# Patient Record
Sex: Female | Born: 1975 | Race: Black or African American | Hispanic: No | Marital: Single | Smoking: Current every day smoker
Health system: Southern US, Community
[De-identification: ages and names within clinical notes are randomized; demographics above are authoritative.]

## PROBLEM LIST (undated history)

## (undated) DIAGNOSIS — J40 Bronchitis, not specified as acute or chronic: Secondary | ICD-10-CM

## (undated) DIAGNOSIS — L309 Dermatitis, unspecified: Secondary | ICD-10-CM

## (undated) HISTORY — PX: FINGER SURGERY: SHX640

---

## 1997-08-02 ENCOUNTER — Inpatient Hospital Stay (HOSPITAL_COMMUNITY): Admission: AD | Admit: 1997-08-02 | Discharge: 1997-08-03 | Payer: Self-pay | Admitting: *Deleted

## 1997-09-30 ENCOUNTER — Emergency Department (HOSPITAL_COMMUNITY): Admission: EM | Admit: 1997-09-30 | Discharge: 1997-09-30 | Payer: Self-pay | Admitting: Emergency Medicine

## 1999-11-22 ENCOUNTER — Emergency Department (HOSPITAL_COMMUNITY): Admission: EM | Admit: 1999-11-22 | Discharge: 1999-11-22 | Payer: Self-pay | Admitting: Emergency Medicine

## 2000-03-21 ENCOUNTER — Other Ambulatory Visit: Admission: RE | Admit: 2000-03-21 | Discharge: 2000-03-21 | Payer: Self-pay | Admitting: Obstetrics

## 2000-03-21 ENCOUNTER — Encounter (INDEPENDENT_AMBULATORY_CARE_PROVIDER_SITE_OTHER): Payer: Self-pay | Admitting: Specialist

## 2000-06-21 ENCOUNTER — Emergency Department (HOSPITAL_COMMUNITY): Admission: EM | Admit: 2000-06-21 | Discharge: 2000-06-22 | Payer: Self-pay | Admitting: Emergency Medicine

## 2001-03-08 ENCOUNTER — Emergency Department (HOSPITAL_COMMUNITY): Admission: EM | Admit: 2001-03-08 | Discharge: 2001-03-08 | Payer: Self-pay | Admitting: Emergency Medicine

## 2001-03-08 ENCOUNTER — Encounter: Payer: Self-pay | Admitting: Emergency Medicine

## 2001-07-12 ENCOUNTER — Emergency Department (HOSPITAL_COMMUNITY): Admission: EM | Admit: 2001-07-12 | Discharge: 2001-07-12 | Payer: Self-pay | Admitting: Emergency Medicine

## 2002-04-24 ENCOUNTER — Inpatient Hospital Stay (HOSPITAL_COMMUNITY): Admission: AD | Admit: 2002-04-24 | Discharge: 2002-04-24 | Payer: Self-pay | Admitting: *Deleted

## 2002-07-29 ENCOUNTER — Inpatient Hospital Stay (HOSPITAL_COMMUNITY): Admission: AD | Admit: 2002-07-29 | Discharge: 2002-07-29 | Payer: Self-pay | Admitting: Obstetrics and Gynecology

## 2002-08-01 ENCOUNTER — Encounter: Payer: Self-pay | Admitting: Obstetrics & Gynecology

## 2002-08-01 ENCOUNTER — Ambulatory Visit (HOSPITAL_COMMUNITY): Admission: RE | Admit: 2002-08-01 | Discharge: 2002-08-01 | Payer: Self-pay | Admitting: Obstetrics & Gynecology

## 2002-10-18 ENCOUNTER — Emergency Department (HOSPITAL_COMMUNITY): Admission: EM | Admit: 2002-10-18 | Discharge: 2002-10-18 | Payer: Self-pay | Admitting: Emergency Medicine

## 2002-10-18 ENCOUNTER — Encounter: Payer: Self-pay | Admitting: Emergency Medicine

## 2003-07-03 ENCOUNTER — Emergency Department (HOSPITAL_COMMUNITY): Admission: EM | Admit: 2003-07-03 | Discharge: 2003-07-03 | Payer: Self-pay | Admitting: Emergency Medicine

## 2004-11-10 ENCOUNTER — Inpatient Hospital Stay (HOSPITAL_COMMUNITY): Admission: EM | Admit: 2004-11-10 | Discharge: 2004-11-12 | Payer: Self-pay | Admitting: Emergency Medicine

## 2004-11-13 ENCOUNTER — Encounter (HOSPITAL_COMMUNITY): Admission: RE | Admit: 2004-11-13 | Discharge: 2005-02-11 | Payer: Self-pay | Admitting: Orthopedic Surgery

## 2004-12-08 ENCOUNTER — Encounter: Admission: RE | Admit: 2004-12-08 | Discharge: 2005-03-08 | Payer: Self-pay | Admitting: Orthopedic Surgery

## 2005-03-02 ENCOUNTER — Emergency Department (HOSPITAL_COMMUNITY): Admission: EM | Admit: 2005-03-02 | Discharge: 2005-03-02 | Payer: Self-pay | Admitting: Emergency Medicine

## 2006-02-09 ENCOUNTER — Emergency Department (HOSPITAL_COMMUNITY): Admission: EM | Admit: 2006-02-09 | Discharge: 2006-02-09 | Payer: Self-pay | Admitting: Emergency Medicine

## 2006-02-28 ENCOUNTER — Emergency Department (HOSPITAL_COMMUNITY): Admission: EM | Admit: 2006-02-28 | Discharge: 2006-02-28 | Payer: Self-pay | Admitting: Family Medicine

## 2006-06-24 ENCOUNTER — Inpatient Hospital Stay (HOSPITAL_COMMUNITY): Admission: AD | Admit: 2006-06-24 | Discharge: 2006-06-24 | Payer: Self-pay | Admitting: Obstetrics & Gynecology

## 2006-07-18 ENCOUNTER — Inpatient Hospital Stay (HOSPITAL_COMMUNITY): Admission: AD | Admit: 2006-07-18 | Discharge: 2006-07-18 | Payer: Self-pay | Admitting: Family Medicine

## 2006-09-26 ENCOUNTER — Ambulatory Visit (HOSPITAL_COMMUNITY): Admission: RE | Admit: 2006-09-26 | Discharge: 2006-09-26 | Payer: Self-pay | Admitting: Obstetrics

## 2006-10-27 ENCOUNTER — Ambulatory Visit (HOSPITAL_COMMUNITY): Admission: RE | Admit: 2006-10-27 | Discharge: 2006-10-27 | Payer: Self-pay | Admitting: Obstetrics

## 2006-11-03 ENCOUNTER — Ambulatory Visit (HOSPITAL_COMMUNITY): Admission: RE | Admit: 2006-11-03 | Discharge: 2006-11-03 | Payer: Self-pay | Admitting: Obstetrics

## 2006-11-17 ENCOUNTER — Inpatient Hospital Stay (HOSPITAL_COMMUNITY): Admission: AD | Admit: 2006-11-17 | Discharge: 2007-01-14 | Payer: Self-pay | Admitting: Obstetrics & Gynecology

## 2006-11-17 ENCOUNTER — Encounter: Payer: Self-pay | Admitting: Obstetrics

## 2006-11-28 ENCOUNTER — Encounter: Payer: Self-pay | Admitting: Obstetrics & Gynecology

## 2006-12-05 ENCOUNTER — Encounter: Payer: Self-pay | Admitting: Obstetrics & Gynecology

## 2006-12-12 ENCOUNTER — Encounter: Payer: Self-pay | Admitting: Obstetrics

## 2006-12-19 ENCOUNTER — Encounter: Payer: Self-pay | Admitting: Obstetrics

## 2006-12-26 ENCOUNTER — Encounter: Payer: Self-pay | Admitting: Obstetrics

## 2007-01-03 ENCOUNTER — Encounter: Payer: Self-pay | Admitting: Obstetrics & Gynecology

## 2007-01-09 ENCOUNTER — Encounter: Payer: Self-pay | Admitting: Obstetrics

## 2007-01-23 ENCOUNTER — Ambulatory Visit (HOSPITAL_COMMUNITY): Admission: RE | Admit: 2007-01-23 | Discharge: 2007-01-23 | Payer: Self-pay | Admitting: Obstetrics

## 2007-01-26 ENCOUNTER — Inpatient Hospital Stay (HOSPITAL_COMMUNITY): Admission: AD | Admit: 2007-01-26 | Discharge: 2007-01-26 | Payer: Self-pay | Admitting: Obstetrics

## 2007-01-26 ENCOUNTER — Ambulatory Visit (HOSPITAL_COMMUNITY): Admission: RE | Admit: 2007-01-26 | Discharge: 2007-01-26 | Payer: Self-pay | Admitting: Obstetrics

## 2007-01-30 ENCOUNTER — Inpatient Hospital Stay (HOSPITAL_COMMUNITY): Admission: AD | Admit: 2007-01-30 | Discharge: 2007-02-02 | Payer: Self-pay | Admitting: Obstetrics & Gynecology

## 2007-01-30 ENCOUNTER — Encounter: Payer: Self-pay | Admitting: Obstetrics

## 2007-01-31 ENCOUNTER — Encounter: Payer: Self-pay | Admitting: Obstetrics & Gynecology

## 2007-03-01 ENCOUNTER — Emergency Department (HOSPITAL_COMMUNITY): Admission: EM | Admit: 2007-03-01 | Discharge: 2007-03-01 | Payer: Self-pay | Admitting: Emergency Medicine

## 2007-11-15 ENCOUNTER — Emergency Department (HOSPITAL_COMMUNITY): Admission: EM | Admit: 2007-11-15 | Discharge: 2007-11-15 | Payer: Self-pay | Admitting: Emergency Medicine

## 2008-03-13 ENCOUNTER — Emergency Department (HOSPITAL_COMMUNITY): Admission: EM | Admit: 2008-03-13 | Discharge: 2008-03-13 | Payer: Self-pay | Admitting: Family Medicine

## 2008-04-28 IMAGING — US US OB TRANSVAGINAL
1 series · 14 of 28 positions shown · non-contrast
Comparison: none

OBSTETRICAL ULTRASOUND:
 This ultrasound was performed in The [HOSPITAL], and the [facility] report will be stored to [REDACTED] PACS.

[Series 1: us ob transvaginal · 14 of 60 slices shown]
[im 3/60]
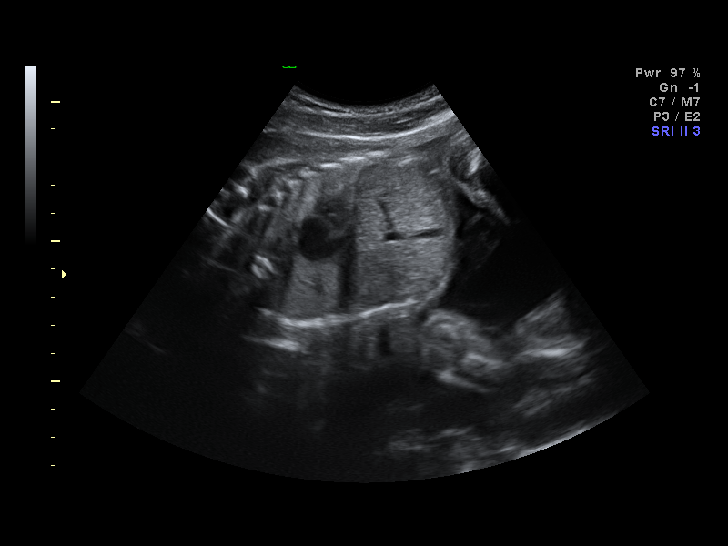
[im 7/60]
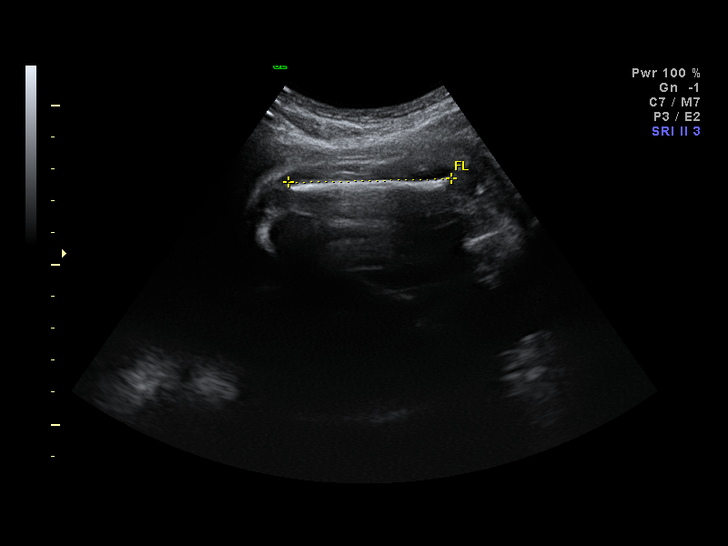
[im 11/60]
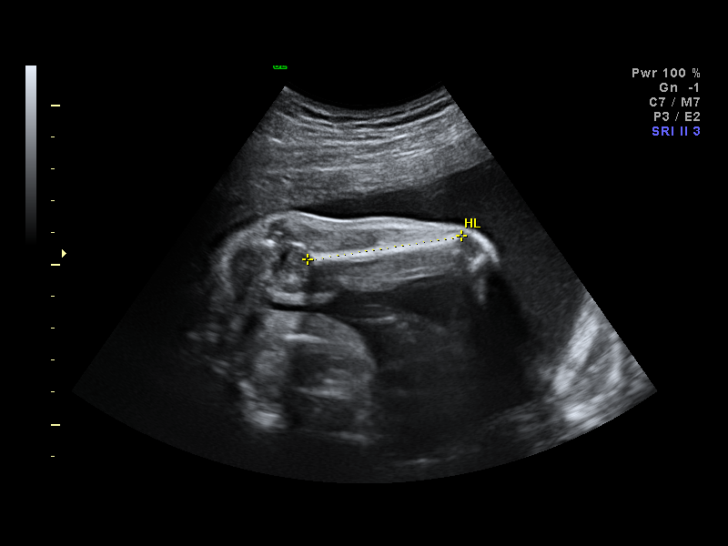
[im 16/60]
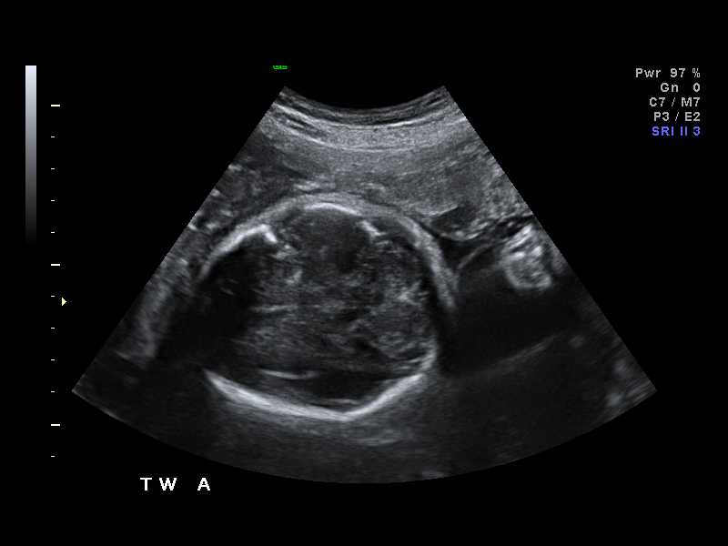
[im 20/60]
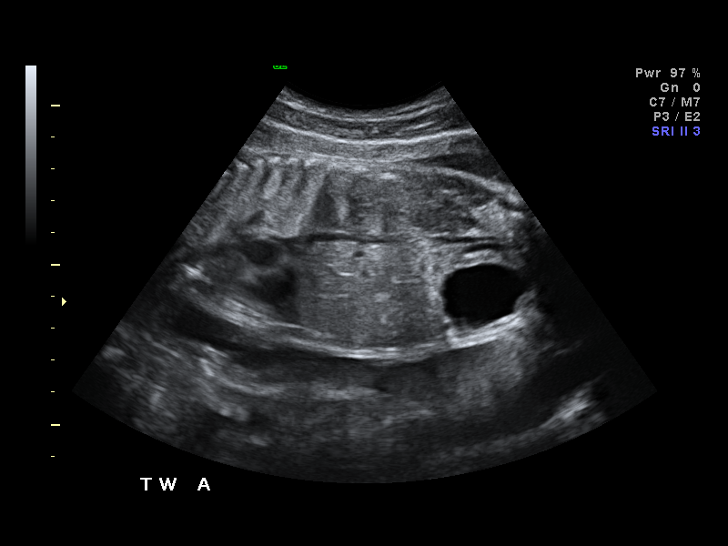
[im 25/60]
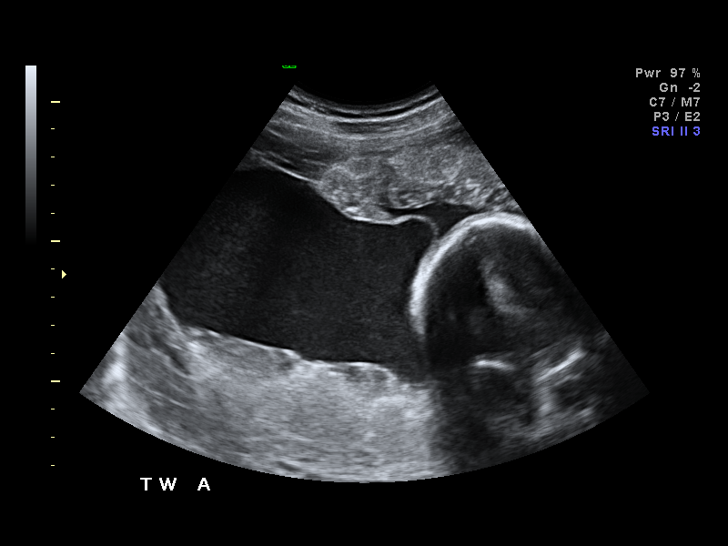
[im 29/60]
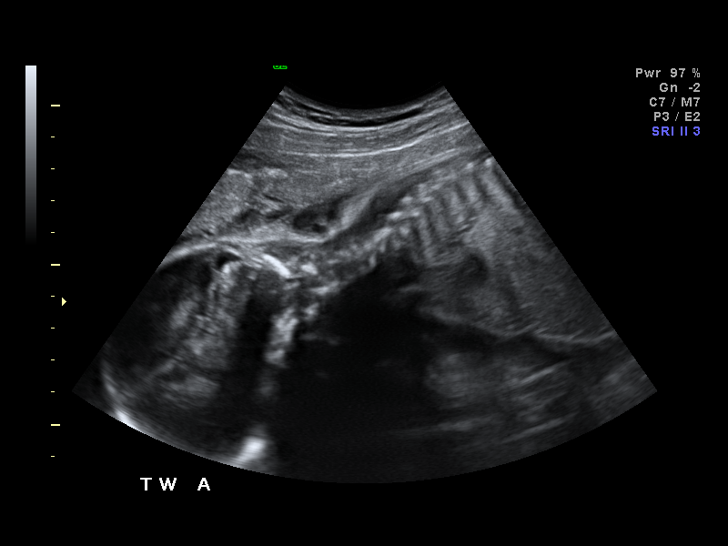
[im 33/60]
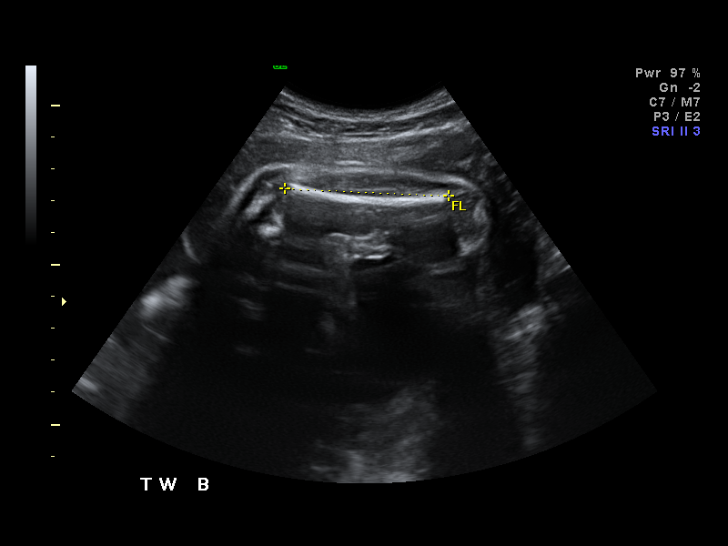
[im 38/60]
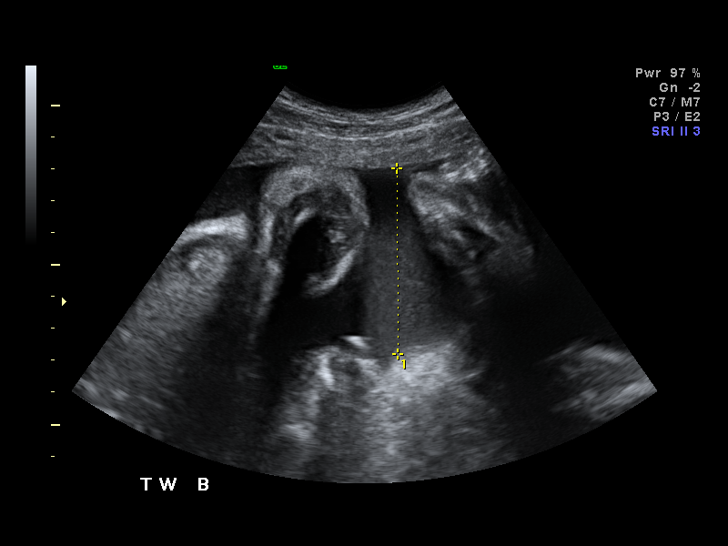
[im 42/60]
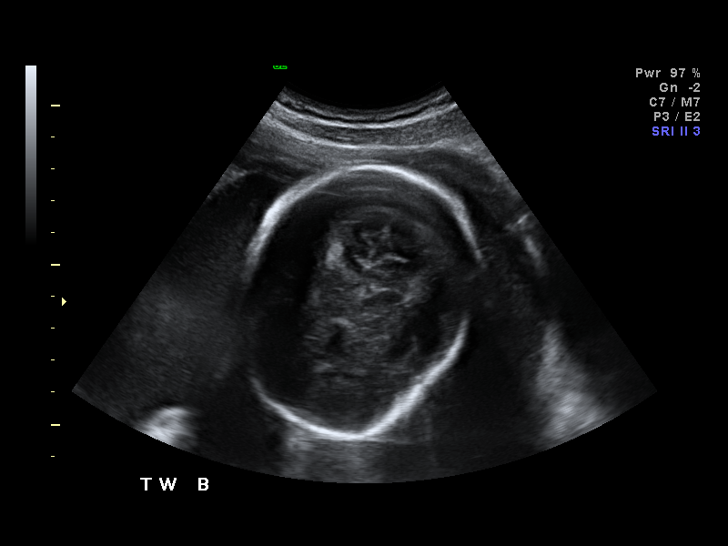
[im 46/60]
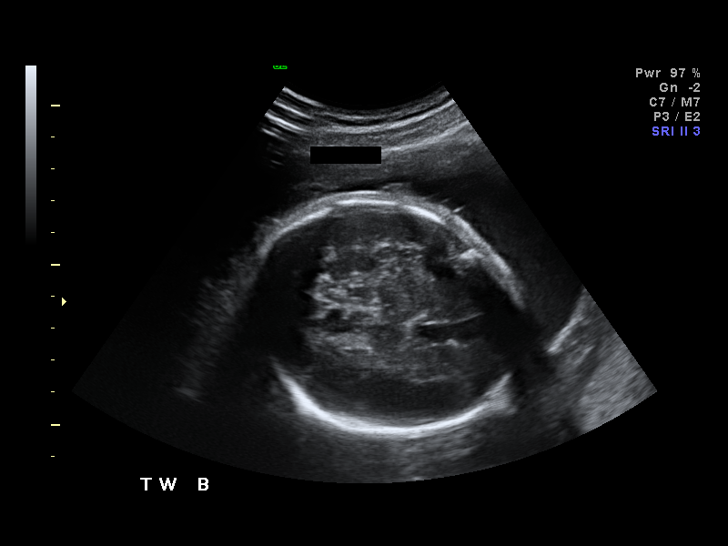
[im 51/60]
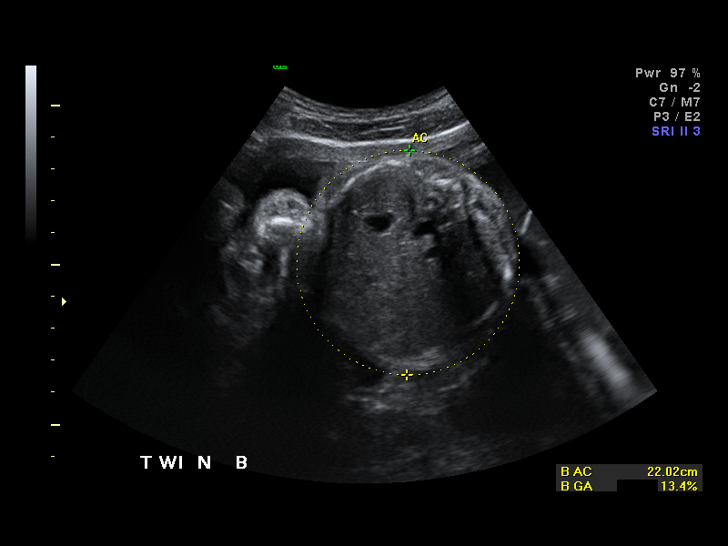
[im 55/60]
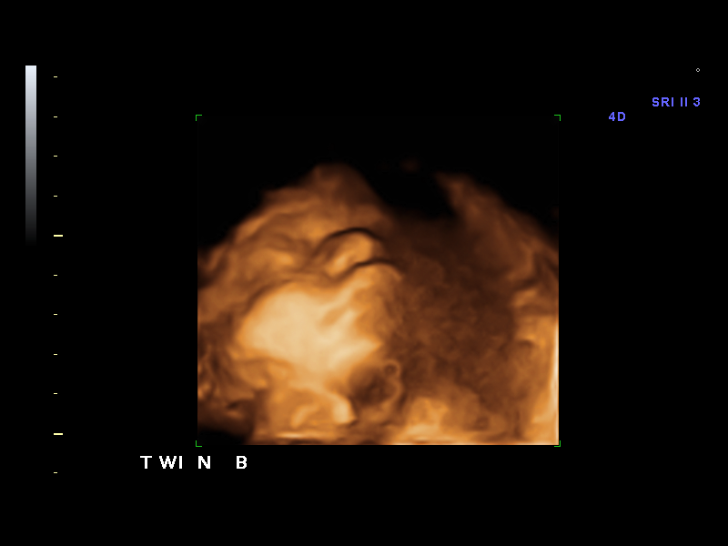
[im 60/60]
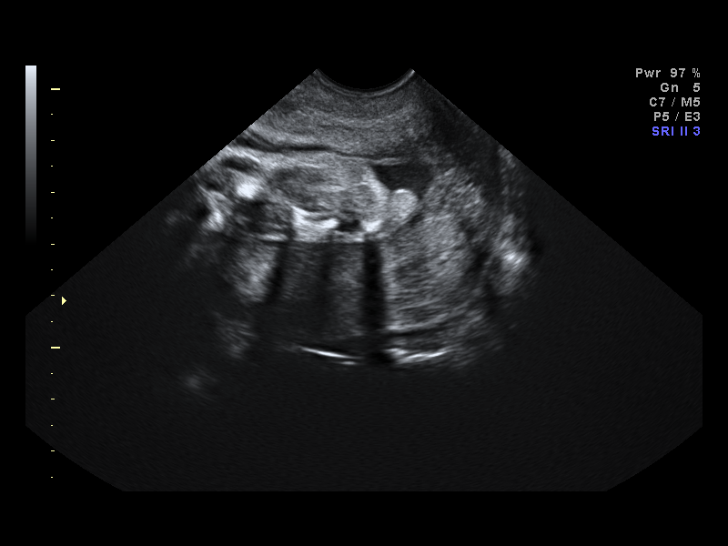

[14 of 28 positions shown; findings below may reference images not displayed]

IMPRESSION: The [facility] report has also been faxed to the ordering physician.

## 2008-05-26 IMAGING — US US OB TRANSVAGINAL MODIFY
1 series · 14 of 28 positions shown · non-contrast
Comparison: none

OBSTETRICAL ULTRASOUND:
 This ultrasound was performed in The [facility]], and the [facility] report will be stored to [REDACTED] PACS.

[Series 1: us ob transvaginal modify · 14 of 65 slices shown]
[im 3/65]
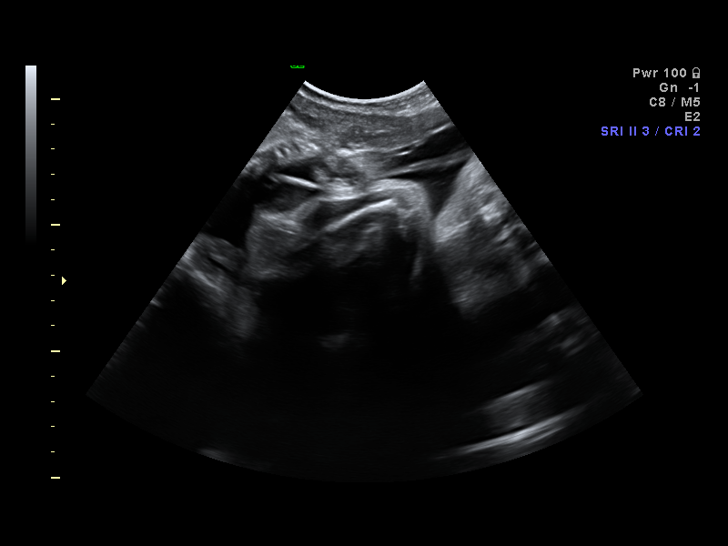
[im 8/65]
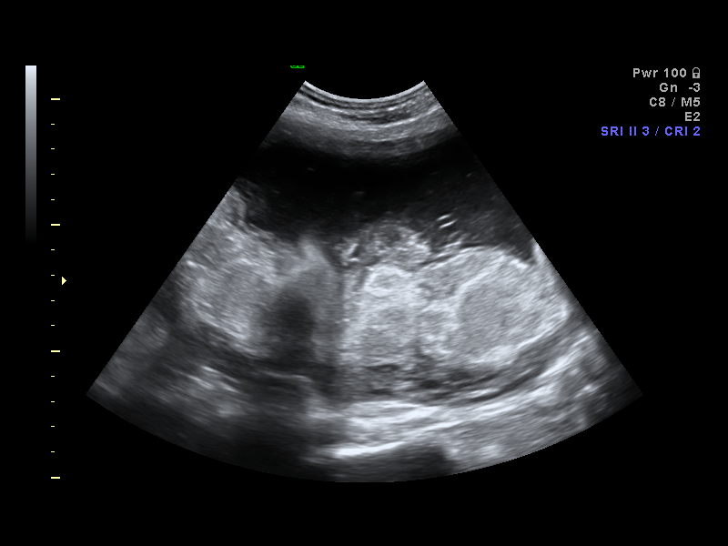
[im 12/65]
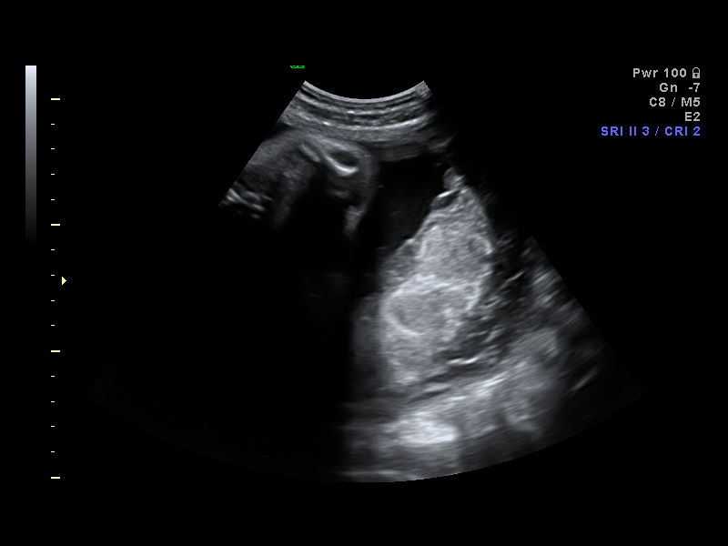
[im 17/65]
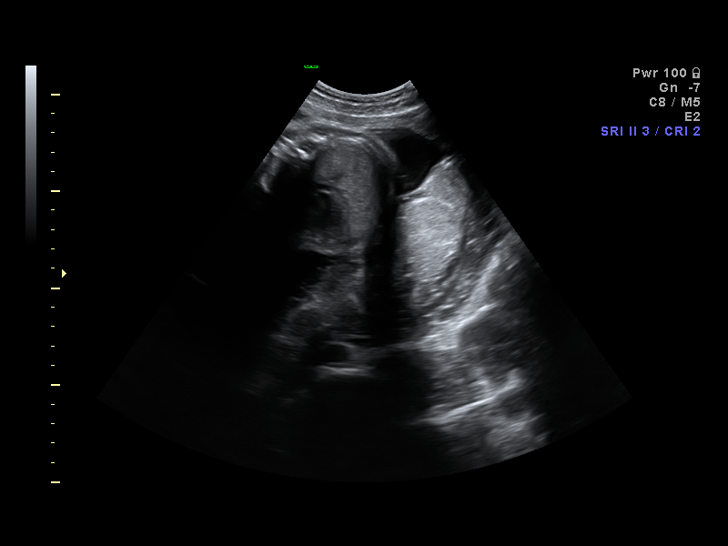
[im 22/65]
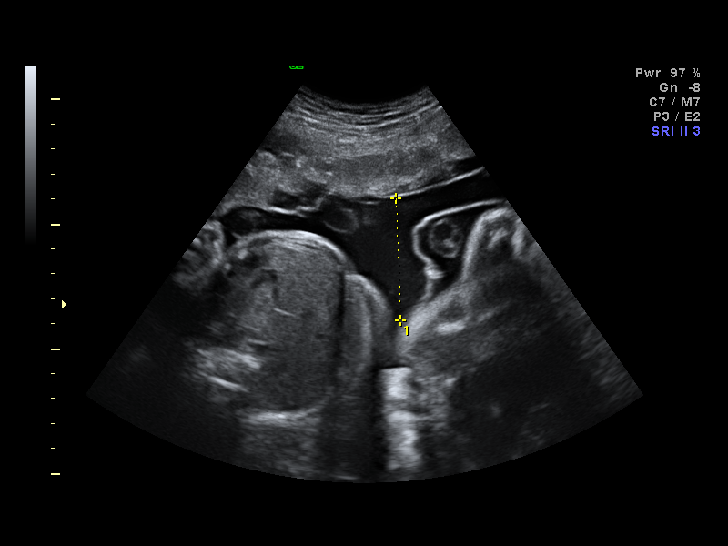
[im 27/65]
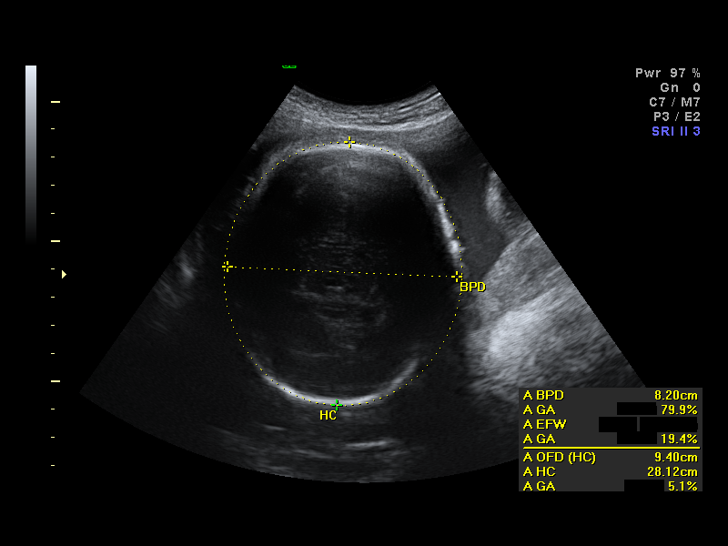
[im 31/65]
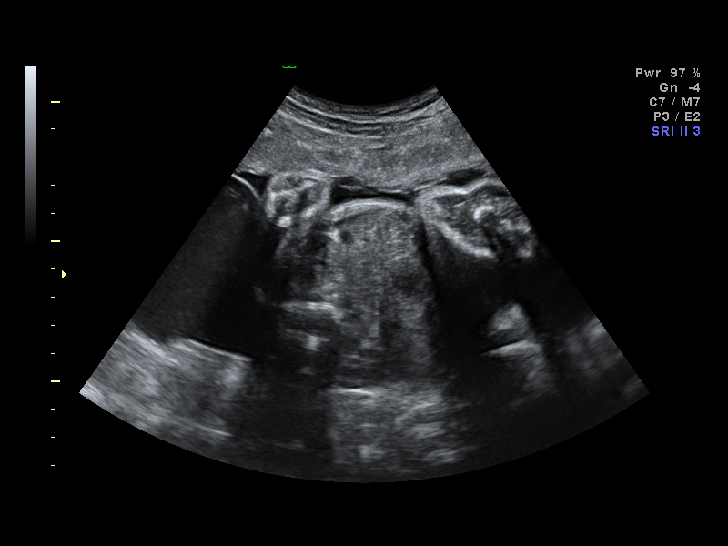
[im 36/65]
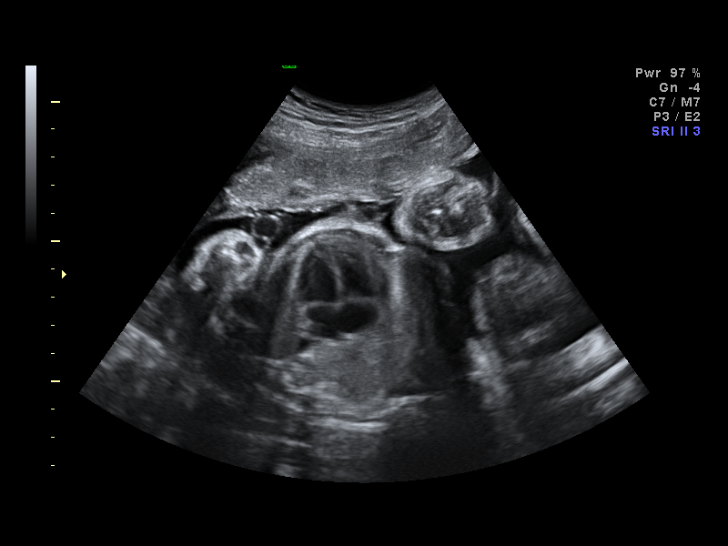
[im 41/65]
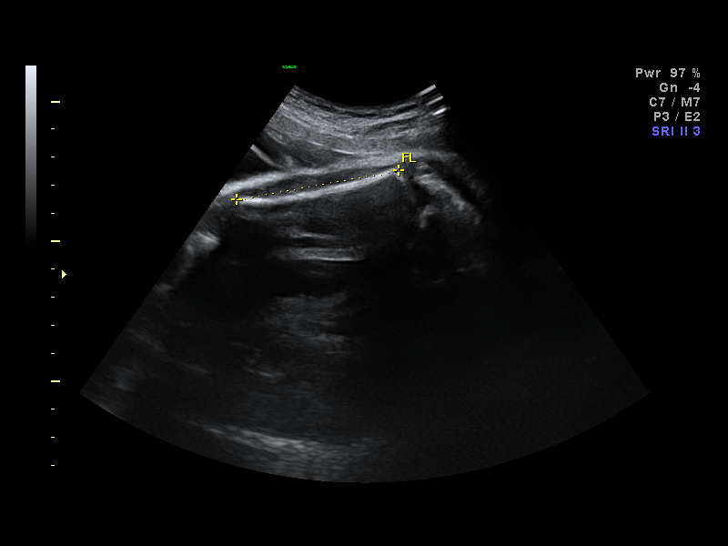
[im 46/65]
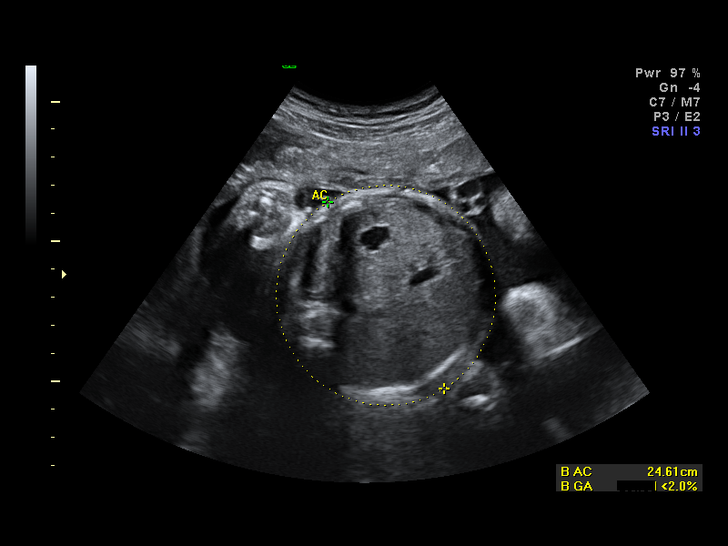
[im 50/65]
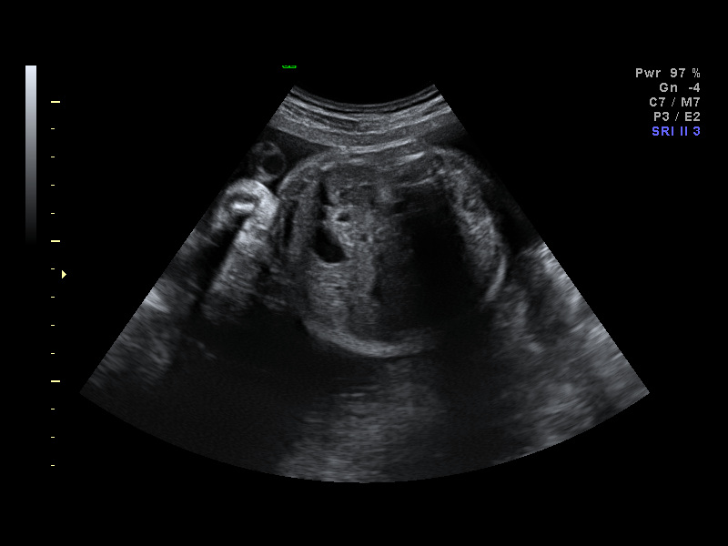
[im 55/65]
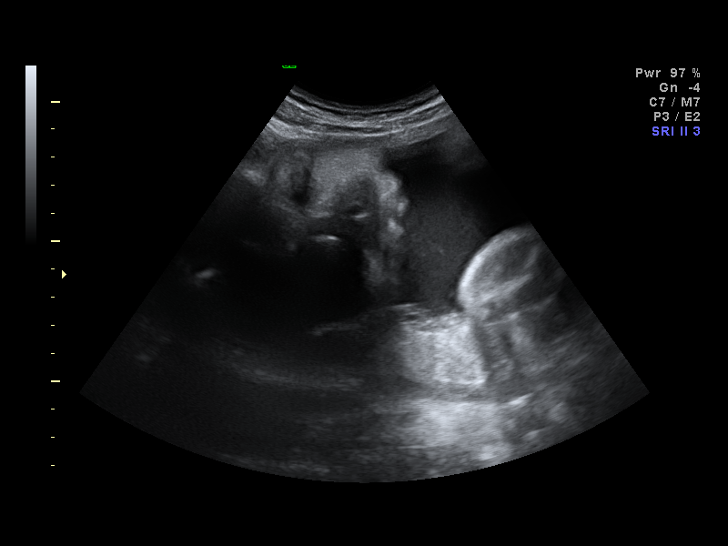
[im 60/65]
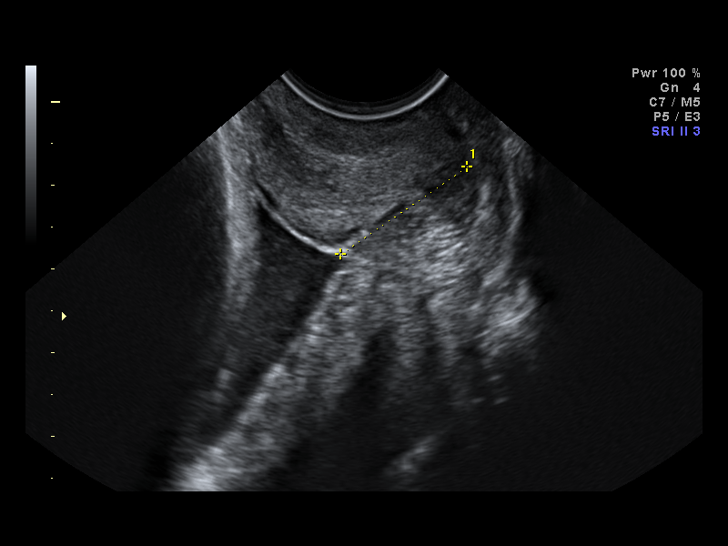
[im 65/65]
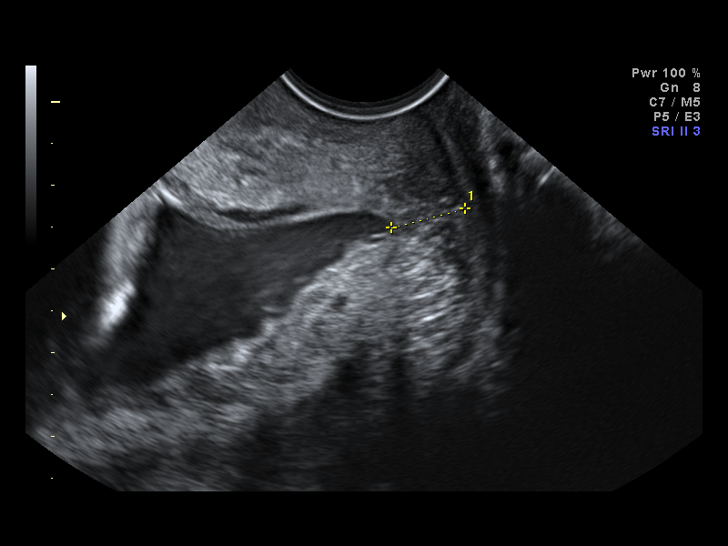

[14 of 28 positions shown; findings below may reference images not displayed]

IMPRESSION: The [facility] report has also been faxed to the ordering physician.

## 2010-06-07 ENCOUNTER — Encounter: Payer: Self-pay | Admitting: Obstetrics

## 2010-06-08 ENCOUNTER — Encounter: Payer: Self-pay | Admitting: Obstetrics

## 2010-09-29 NOTE — H&P (Signed)
NAMESEARRA, [name]          ACCOUNT NO.:  [id number]   MEDICAL RECORD NO.:  [id number]          PATIENT TYPE:  INP   LOCATION:  9157                          FACILITY:  [facility]   PHYSICIAN:  Roseanna Rainbow, M.D.DATE OF BIRTH:  10/10/1975   DATE OF ADMISSION:  11/17/2006  DATE OF DISCHARGE:                              HISTORY & PHYSICAL   CHIEF COMPLAINT:  The patient is a 35 year old, gravida 4, para 1 with  an estimated date of confinement of October 9 with a twin gestation at  26+ weeks with threatened preterm labor.   HISTORY OF PRESENT ILLNESS:  The patient has a history of a preterm  delivery at 27 weeks and a possible second trimester spontaneous  abortion.  She has [facility].  Heightened surveillance  has included serial cervical length ultrasound studies as well as  ultrasounds for growth.  She has also been receiving weekly 17  hydroxyprogesterone injections.  On ultrasound today, the cervical  length was 1.1 cm with funneling noted.  The patient reports  intermittent contractions.  She denies any leakage of fluid or abnormal  discharge.   ALLERGIES:  No known drug allergies.   MEDICATIONS:  Prenatal vitamins, please see the above.   OB RISK FACTORS:  Please see the above.  The patient is Rh negative.   PRENATAL SCREENS:  Hematocrit 28.4, hemoglobin 9.5, platelets 246,000,  Chlamydia probe negative.  GC probe negative, 1 hour GCT 72, urine  culture and sensitivity no growth.  HIV nonreactive.  Blood type is O  negative, antibody screen negative, RPR nonreactive, rubella equivocal,  sickle cell negative.   SOCIAL HISTORY:  She denies any alcohol use.  She reports cigars  occasionally.   PAST GYN HISTORY:  1. History of Trichomonas.  2. D&C for a voluntary termination of pregnancy.   PAST MEDICAL HISTORY:  Eczema.   PAST SURGICAL HISTORY:  Hand surgery.   PAST  OBSTETRICAL HISTORY:  1. In '94 she was delivered of a female at 14 weeks.  That pregnancy      was complicated by preterm labor, preterm delivery.  2. In '99 she had a second trimester spontaneous abortion.  3. In 2004 there was a voluntary termination of pregnancy.   PHYSICAL EXAMINATION:  VITAL SIGNS:  Temperature 98.2, blood pressure  93/57.  Fetal heart tracings consistent with the estimated gestational  age x2.  Tocodynamometer rare low amplitude, less than 30 second  duration contractions.  PELVIC EXAM:  Deferred.   LABORATORY DATA:  Wet prep negative.  Hemoglobin 9.4, urinalysis  negative.   ASSESSMENT:  1. Twin gestation at 26+ weeks.  2. Threatened preterm labor.  3. Rh negative.   PLAN:  1. Bed rest.  2. Magnesium sulfate tocolysis.  3. Betamethasone.  4. Continue 17P injection.  5. Clindamycin parenterally.      Roseanna Rainbow, M.D.  Electronically Signed     LAJ/MEDQ  D:  11/17/2006  T:  11/17/2006  Job:  [id number]

## 2010-09-29 NOTE — Discharge Summary (Signed)
[name], [name]          ACCOUNT NO.:  [id number]   MEDICAL RECORD NO.:  [id number]          PATIENT TYPE:  INP   LOCATION:  9152                          FACILITY:  [facility]   PHYSICIAN:  Roseanna Rainbow, M.D.DATE OF BIRTH:  03-15-1976   DATE OF ADMISSION:  11/17/2006  DATE OF DISCHARGE:  01/14/2007                               DISCHARGE SUMMARY   CHIEF COMPLAINT:  The patient is a 35 year old gravida 4, para 1 with an  estimated date of confinement of October 9 with a twin gestation at 26+  weeks with complaints of contractions.  Please see the dictated History  and Physical for further details.   HOSPITAL COURSE:  The patient was admitted.  She was started on  magnesium sulfate for tocolysis.  On ultrasound the cervical length was  1.1 cm in length with funneling.  The patient was adequately tocalized,  and her contractions became infrequent.  On repeat cervical length  measurement on July 7 the cervix was 0.8 to 1 cm in length.  There was  no change essentially.  She was given a course of parenteral clindamycin  on ultrasound on September 15.  Again the cervical length was stable at  0.821 cm with funneling.  Stable growth of the twins.  The patient plans  a sterilization.  The 30-day Medicaid papers were signed on August 16.  An ultrasound on August 19 the BPP's were 8/8 for both fetuses. AFI was  normal for both fetuses.  The cervical length was 1.2 cm with funneling.  Magnesium sulfate was discontinued at [redacted] weeks gestation on August 28.  She remained stable without the magnesium sulfate.  On August 30 the  cervix was examined and was a fingertip with lower uterine segment  development appreciated.  At this point the decision was made to  discharge the patient to home.   DISCHARGE DIAGNOSES:  Twin gestation, threatened preterm labor.   CONDITION:  Stable.   DIET:  Regular.   ACTIVITY:  Modified bed rest.   DISPOSITION:  The patient was to follow up in the  office in one week.   MEDICATIONS:  Continue prenatal vitamins.      Roseanna Rainbow, M.D.  Electronically Signed     LAJ/MEDQ  D:  02/24/2007  T:  02/25/2007  Job:  [id number]

## 2010-09-29 NOTE — H&P (Signed)
[name], [name]          ACCOUNT NO.:  [id number]   MEDICAL RECORD NO.:  [id number]          PATIENT TYPE:  INP   LOCATION:  9168                          [facility]ACILITY:  [facility]   PHYSICIAN:  Roseanna Rainbow, M.D.DATE OF BIRTH:  12-May-1976   DATE OF ADMISSION:  01/30/2007  DATE OF DISCHARGE:                              HISTORY & PHYSICAL   CHIEF COMPLAINT:  The patient is a 35 year old gravida 4, para 1 with an  estimated date of confinement of February 23, 2007, with a twin gestation  at 36+ weeks, for induction of labor secondary to intrauterine growth  restriction, discordant growth and elevated umbilical artery Doppler  flow indices.   HISTORY OF PRESENT ILLNESS:  Please see the above. The patient had  presented to the maternal fetal center earlier today for routine  antenatal testing. A BPP for twin A was 6 out of 10, a BPP for twin B  was 8 out of 10.  The umbilical artery Doppler S/D ratios were elevated  for both fetuses.  Biometry for twin A was consistent with fetal growth  restriction as well as discordant growth noted for both twins.   ALLERGIES:  No known drug allergies.   MEDICATIONS:  None.   SOCIAL HISTORY:  She denies any tobacco, ethanol or drug use.   OB RISK FACTORS:  Twin gestation, threatened preterm labor, please see  the above prenatal labs. Rh negative, non sensitized.   PRENATAL LABS:  Blood type is O negative, antibody screen negative. RPR  is nonreactive, rubella titer is equivocal, hepatitis B surface antigen  negative.  GBS unknown.  HIV nonreactive.  Hemoglobin 9.5, hematocrit  28.4, platelets 246,000.  Chlamydia probe negative. GC probe negative.  One hour GCT 72. Sickle cell negative.   PAST OBSTETRICAL HISTORY:  There is a history of a preterm vaginal  delivery at 27 weeks, and a spontaneous abortion.   PAST SURGICAL HISTORY:  Torn ligament repair.   PAST MEDICAL HISTORY:  She denies.   PHYSICAL EXAMINATION:  VITAL SIGNS:  Stable,  afebrile. Fetal heart  tracings minimally reactive x2.  Tocodynamometry - irregular uterine  contractions.  STERILE VAGINAL EXAM:  Per the RN, cervix is 5 cm dilated, 60-80%  effaced. Twin A's head is ballotable.   ASSESSMENT:  Twin gestation at 36+ weeks with intrauterine growth  restriction, discordant growth. Suspicious antenatal testing with  elevated umbilical artery S/D ratios.  Favorable Bishop score.   PLAN:  Admission, induction of labor. The planned mode of induction is  pitocin artificial rupture of membranes. Will treat empirically for GBS  prophylaxis.      Roseanna Rainbow, M.D.  Electronically Signed     LAJ/MEDQ  D:  01/30/2007  T:  01/30/2007  Job:  [id number]

## 2010-09-29 NOTE — Discharge Summary (Signed)
NAMEBRETTA, FEES          ACCOUNT NO.:  [id number]   MEDICAL RECORD NO.:  [id number]          PATIENT TYPE:  INP   LOCATION:  9104                          [facility]ACILITY:  [facility]   PHYSICIAN:  Roseanna Rainbow, M.D.DATE OF BIRTH:  [birth year]   DATE OF ADMISSION:  01/30/2007  DATE OF DISCHARGE:  02/02/2007                               DISCHARGE SUMMARY   CHIEF COMPLAINT:  The patient is a 35 year old gravida 4, para 1 with an  estimated date of confinement of October 9th, with a twin gestation at  36+ weeks for induction of labor, secondary to intrauterine growth  restriction, discordant growth an elevated umbilical artery Doppler flow  indices.  Please see the dictated history and physical for further  details.   HOSPITAL COURSE:  The patient was admitted.  Low-dose Pitocin was  started per protocol.  The membranes were artificially ruptured in early  active labor.  She progressed to fully dilated.  Twin A was delivered  vertex.  Twin B remained transverse back up.  There was an attempt to  perform an external cephalic version that failed, and the decision was  made to proceed with a cesarean delivery for the second twin. Please see  the dictated operative summary for further details.  On postoperative  day #1, her hemoglobin was 8.7.  She was hemodynamically stable.  The  remainder of her hospital course was uneventful.  She was discharged to  home on postoperative day #2.   DISCHARGE DIAGNOSIS:  Twin gestation at 21 weeks, discordant growth,  intrauterine growth restriction, elevated umbilical artery Doppler  indices.   PROCEDURE:  Vaginal delivery and cesarean delivery, condition stable.   DIET:  Regular.   ACTIVITY:  Progressive activity, pelvic rest.   MEDICATIONS:  Included Percocet, ibuprofen, prenatal vitamins, ferrous  sulfate and Colace.   DISPOSITION:  The patient was to follow up on September 19 in the office  for staple removal.      Roseanna Rainbow, M.D.  Electronically Signed     LAJ/MEDQ  D:  02/24/2007  T:  02/25/2007  Job:  [id number]

## 2010-09-29 NOTE — Op Note (Signed)
[name], [name]          ACCOUNT NO.:  [id number]   MEDICAL RECORD NO.:  [id number]          PATIENT TYPE:  INP   LOCATION:  9104                          [facility]   PHYSICIAN:  Roseanna Rainbow, M.D.DATE OF BIRTH:  Jun 03, 1975   DATE OF PROCEDURE:  01/31/2007  DATE OF DISCHARGE:                               OPERATIVE REPORT   PREOPERATIVE DIAGNOSIS:  Twin B, transverse lie, post delivery of twin  A,  failed external cephalic version attempt, discordant growth with  twin B being the larger twin, desires sterilization procedure.   POSTOPERATIVE DIAGNOSIS:  Twin B, transverse lie, post delivery of twin  A,  failed external cephalic version attempt, discordant growth with  twin B being the larger twin, desires sterilization procedure.   PROCEDURE:  Primary low uterine flap elliptical cesarean delivery and  bilateral tubal ligation.   SURGEON:  Roseanna Rainbow, M.D.   PATHOLOGY:  Placenta.   ANESTHESIA:  Epidural.   ESTIMATED BLOOD LOSS:  800 mL.   COMPLICATIONS:  None.   DESCRIPTION OF PROCEDURE:  The patient was placed in the dorsal supine  position with a leftward tilt and prepped and draped in the usual  sterile fashion.   After a timeout had been completed, a Pfannenstiel skin incision was  then made with the scalpel and carried down to the underlying fascia.  The fascia was incised along the length of the incision with the  scalpel.  The rectus muscles were separated bluntly.  The parietal  peritoneum was entered bluntly, and the incision was extended bluntly.  The vesicouterine peritoneum was tented up and entered sharply.  This  incision was then extended bilaterally and a bladder flap created  bluntly.  The bladder blade was placed.  The lower uterine segment was  incised in a transverse fashion with the scalpel.  The uterine incision  was then extended bluntly.  A complete breech extraction was then  performed.  Mauriceau-Smellie-Veit  maneuver was used to facilitate  delivery of the infant's head which was delivered atraumatically.  The  oropharynx was suctioned with a bulb suction.  The cord was clamped and  cut.  The infant was handed off to the awaiting neonatologist.  The  placenta was then removed.  The intrauterine cavity was evacuated of any  remaining amniotic fluid, clots, and debris with a moistened laparotomy  sponge.  The uterine incision was then reapproximated in a running  interlocking fashion using 0 Monocryl.   The left fallopian tube was then identified.  The fallopian tube was  grasped with a Babcock clamp at the mid-isthmic portion.  A window was  made in the mesosalpinx, and a 2-cm segment of tube was then tied off  with 0 plain ties and then excised.  Adequate hemostasis was noted.  The  right fallopian tube was then identified.  The mid-isthmic portion of  the tube was grasped with a Babcock clamp.  A 2-cm segment of tube was  then doubly ligated with 0 plain and excised.  Adequate hemostasis was  noted.  The paracolic gutters were then irrigated.  The parietal  peritoneum was  reapproximated in a running fashion using 2-0 Vicryl.  The fascia was closed in a running fashion  using 0 PDS.  The skin was closed with staples.  At the close of the  procedure, the instrument and pack counts were said to be correct x2.   The patient was taken to the PACU awake and in stable condition.      Roseanna Rainbow, M.D.  Electronically Signed     LAJ/MEDQ  D:  01/31/2007  T:  01/31/2007  Job:  [id number]
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
## 2010-11-23 ENCOUNTER — Inpatient Hospital Stay (INDEPENDENT_AMBULATORY_CARE_PROVIDER_SITE_OTHER)
Admission: RE | Admit: 2010-11-23 | Discharge: 2010-11-23 | Disposition: A | Discharge status: Home / Self Care | Payer: Self-pay | Source: Ambulatory Visit | Attending: Family Medicine | Admitting: Family Medicine

## 2010-11-23 DIAGNOSIS — M25519 Pain in unspecified shoulder: Secondary | ICD-10-CM

## 2011-02-25 LAB — RH IMMUNE GLOB WKUP(>/=20WKS)(NOT WOMEN'S HOSP)

## 2011-02-25 LAB — CBC
HCT: 30.8 — ABNORMAL LOW
Hemoglobin: 10.7 — ABNORMAL LOW
MCHC: 34.7
MCHC: 34.7
MCV: 91.2
Platelets: 199
RDW: 12.6
RDW: 12.6

## 2011-02-26 LAB — RH IMMUNE GLOBULIN WORKUP (NOT WOMEN'S HOSP): Antibody Screen: NEGATIVE

## 2011-03-02 LAB — CBC
HCT: 27.6 — ABNORMAL LOW
Hemoglobin: 9.4 — ABNORMAL LOW
Platelets: 255
RBC: 2.96 — ABNORMAL LOW
RDW: 13.4

## 2011-03-02 LAB — URINALYSIS, ROUTINE W REFLEX MICROSCOPIC
Bilirubin Urine: NEGATIVE
Hgb urine dipstick: NEGATIVE
Specific Gravity, Urine: 1.01
Urobilinogen, UA: 0.2

## 2011-03-02 LAB — WET PREP, GENITAL
Clue Cells Wet Prep HPF POC: NONE SEEN
Trich, Wet Prep: NONE SEEN
Yeast Wet Prep HPF POC: NONE SEEN

## 2011-07-23 ENCOUNTER — Emergency Department (HOSPITAL_COMMUNITY): Payer: Self-pay

## 2011-07-23 ENCOUNTER — Emergency Department (HOSPITAL_COMMUNITY)
Admission: EM | Admit: 2011-07-23 | Discharge: 2011-07-23 | Disposition: A | Discharge status: Home / Self Care | Payer: Self-pay | Attending: Emergency Medicine | Admitting: Emergency Medicine

## 2011-07-23 ENCOUNTER — Encounter (HOSPITAL_COMMUNITY): Payer: Self-pay | Admitting: Emergency Medicine

## 2011-07-23 DIAGNOSIS — R059 Cough, unspecified: Secondary | ICD-10-CM | POA: Insufficient documentation

## 2011-07-23 DIAGNOSIS — J349 Unspecified disorder of nose and nasal sinuses: Secondary | ICD-10-CM

## 2011-07-23 DIAGNOSIS — J3489 Other specified disorders of nose and nasal sinuses: Secondary | ICD-10-CM | POA: Insufficient documentation

## 2011-07-23 DIAGNOSIS — R05 Cough: Secondary | ICD-10-CM | POA: Insufficient documentation

## 2011-07-23 DIAGNOSIS — J40 Bronchitis, not specified as acute or chronic: Secondary | ICD-10-CM | POA: Insufficient documentation

## 2011-07-23 DIAGNOSIS — R51 Headache: Secondary | ICD-10-CM | POA: Insufficient documentation

## 2011-07-23 HISTORY — DX: Dermatitis, unspecified: L30.9

## 2011-07-23 MED ORDER — ALBUTEROL SULFATE HFA 108 (90 BASE) MCG/ACT IN AERS
2.0000 | INHALATION_SPRAY | RESPIRATORY_TRACT | Status: DC | PRN
  Administered 2011-07-23: 2 via RESPIRATORY_TRACT
  Filled 2011-07-23: qty 6.7

## 2011-07-23 MED ORDER — AZITHROMYCIN 250 MG PO TABS: 250.0000 mg | ORAL_TABLET | Freq: Every day | ORAL | Status: AC

## 2011-07-23 NOTE — ED Notes (Signed)
RT made aware of the albuterol HFA

## 2011-07-23 NOTE — ED Provider Notes (Signed)
Medical screening examination/treatment/procedure(s) were performed by non-physician practitioner and as supervising physician I was immediately available for consultation/collaboration.  Jase Reep R. Keitra Carusone, MD 07/23/11 0743 
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
## 2011-07-23 NOTE — Discharge Instructions (Signed)
Bronchitis Bronchitis is a problem of the air tubes leading to your lungs. This problem makes it hard for air to get in and out of the lungs. You may cough a lot because your air tubes are narrow. Going without care can cause lasting (chronic) bronchitis. HOME CARE   Drink enough fluids to keep your pee (urine) clear or pale yellow.   Use a cool mist humidifier.   Quit smoking if you smoke. If you keep smoking, the bronchitis might not get better.   Only take medicine as told by your doctor.  GET HELP RIGHT AWAY IF:   Coughing keeps you awake.   You start to wheeze.   You become more sick or weak.   You have a hard time breathing or get short of breath.   You cough up blood.   Coughing lasts more than 2 weeks.   You have a fever.   Your baby is older than 3 months with a rectal temperature of 102 F (38.9 C) or higher.   Your baby is 40 months old or younger with a rectal temperature of 100.4 F (38 C) or higher.  MAKE SURE YOU:  Understand these instructions.   Will watch your condition.   Will get help right away if you are not doing well or get worse.  Document Released: 10/20/2007 Document Revised: 04/22/2011 Document Reviewed: 04/04/2009 [facility] Patient Information 2012 [place], [place]. Use the inhaler as follows 2 puffs every 4-6 hours while awake if you wait during the night for the next 2 nights.  Feel free to use it one time after the 2 days.  Her use as neededSinusitis Sinuses are air pockets within the bones of your face. The growth of bacteria within a sinus leads to infection. The infection prevents the sinuses from draining. This infection is called sinusitis. SYMPTOMS  There will be different areas of pain depending on which sinuses have become infected.  The maxillary sinuses often produce pain beneath the eyes.   Frontal sinusitis may cause pain in the middle of the forehead and above the eyes.  Other problems (symptoms) include:  Toothaches.    Colored, pus-like (purulent) drainage from the nose.   Swelling, warmth, and tenderness over the sinus areas may be signs of infection.  TREATMENT  Sinusitis is most often determined by an exam.X-rays may be taken. If x-rays have been taken, make sure you obtain your results or find out how you are to obtain them. Your caregiver may give you medications (antibiotics). These are medications that will help kill the bacteria causing the infection. You may also be given a medication (decongestant) that helps to reduce sinus swelling.  HOME CARE INSTRUCTIONS   Only take over-the-counter or prescription medicines for pain, discomfort, or fever as directed by your caregiver.   Drink extra fluids. Fluids help thin the mucus so your sinuses can drain more easily.   Applying either moist heat or ice packs to the sinus areas may help relieve discomfort.   Use saline nasal sprays to help moisten your sinuses. The sprays can be found at your local drugstore.  SEEK IMMEDIATE MEDICAL CARE IF:  You have a fever.   You have increasing pain, severe headaches, or toothache.   You have nausea, vomiting, or drowsiness.   You develop unusual swelling around the face or trouble seeing.  MAKE SURE YOU:   Understand these instructions.   Will watch your condition.   Will get help right away if you are not  doing well or get worse.  Document Released: 05/03/2005 Document Revised: 04/22/2011 Document Reviewed: 11/30/2006 [facility] Patient Information 2012 [place], [place].

## 2011-07-23 NOTE — ED Notes (Signed)
Pt presented to the ER with sx of flue like s/s, states it all started about 3 weeks ago with cough, however, over the cores of time s/s are not improving, develop chest discomfort, cough, nausea and vomiting, tonight prior to arrival, denies diarrhea, reports generalizes soreness and weakness.

## 2011-10-22 ENCOUNTER — Encounter (HOSPITAL_COMMUNITY): Payer: Self-pay

## 2011-10-22 ENCOUNTER — Emergency Department (HOSPITAL_COMMUNITY)
Admission: EM | Admit: 2011-10-22 | Discharge: 2011-10-22 | Disposition: A | Discharge status: Home / Self Care | Payer: No Typology Code available for payment source | Attending: Emergency Medicine | Admitting: Emergency Medicine

## 2011-10-22 DIAGNOSIS — M62838 Other muscle spasm: Secondary | ICD-10-CM | POA: Insufficient documentation

## 2011-10-22 DIAGNOSIS — S63509A Unspecified sprain of unspecified wrist, initial encounter: Secondary | ICD-10-CM

## 2011-10-22 MED ORDER — METHOCARBAMOL 500 MG PO TABS: 1000.0000 mg | ORAL_TABLET | Freq: Four times a day (QID) | ORAL | Status: AC

## 2011-10-22 MED ORDER — NAPROXEN 500 MG PO TABS: 500.0000 mg | ORAL_TABLET | Freq: Two times a day (BID) | ORAL | Status: AC

## 2011-10-22 NOTE — Progress Notes (Signed)
Orthopedic Tech Progress Note Patient Details:  [name] August 20, 1975 [id number]  Ortho Devices Type of Ortho Device: Velcro wrist splint Ortho Device/Splint Interventions: Application   Cammer, Mickie Bail 10/22/2011, 2:14 PM
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
## 2011-10-22 NOTE — ED Provider Notes (Signed)
Medical screening examination/treatment/procedure(s) were performed by non-physician practitioner and as supervising physician I was immediately available for consultation/collaboration.   Celene Kras, MD 10/22/11 1400
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
## 2013-02-16 ENCOUNTER — Emergency Department (HOSPITAL_COMMUNITY): Payer: No Typology Code available for payment source

## 2013-02-16 ENCOUNTER — Emergency Department (HOSPITAL_COMMUNITY)
Admission: EM | Admit: 2013-02-16 | Discharge: 2013-02-16 | Disposition: A | Discharge status: Home / Self Care | Payer: Self-pay | Attending: Emergency Medicine | Admitting: Emergency Medicine

## 2013-02-16 DIAGNOSIS — R110 Nausea: Secondary | ICD-10-CM | POA: Insufficient documentation

## 2013-02-16 DIAGNOSIS — Z872 Personal history of diseases of the skin and subcutaneous tissue: Secondary | ICD-10-CM | POA: Insufficient documentation

## 2013-02-16 DIAGNOSIS — S51809A Unspecified open wound of unspecified forearm, initial encounter: Secondary | ICD-10-CM | POA: Insufficient documentation

## 2013-02-16 DIAGNOSIS — S0990XA Unspecified injury of head, initial encounter: Secondary | ICD-10-CM | POA: Insufficient documentation

## 2013-02-16 DIAGNOSIS — S41111A Laceration without foreign body of right upper arm, initial encounter: Secondary | ICD-10-CM

## 2013-02-16 DIAGNOSIS — Z880 Allergy status to penicillin: Secondary | ICD-10-CM | POA: Insufficient documentation

## 2013-02-16 DIAGNOSIS — H538 Other visual disturbances: Secondary | ICD-10-CM | POA: Insufficient documentation

## 2013-02-16 DIAGNOSIS — Z23 Encounter for immunization: Secondary | ICD-10-CM | POA: Insufficient documentation

## 2013-02-16 LAB — BASIC METABOLIC PANEL
BUN: 6 mg/dL (ref 6–23)
Creatinine, Ser: 0.81 mg/dL (ref 0.50–1.10)
GFR calc Af Amer: >90 mL/min (ref >90)
GFR calc non Af Amer: >90 mL/min (ref >90)
Glucose, Bld: 86 mg/dL (ref 70–99)
Potassium: 3.6 mEq/L (ref 3.5–5.1)

## 2013-02-16 LAB — CBC
HCT: 31.4 % — ABNORMAL LOW (ref 36.0–46.0)
Hemoglobin: 10.9 g/dL — ABNORMAL LOW (ref 12.0–15.0)
MCH: 28.6 pg (ref 26.0–34.0)
MCHC: 34.7 g/dL (ref 30.0–36.0)
MCV: 82.4 fL (ref 78.0–100.0)
RDW: 15.3 % (ref 11.5–15.5)

## 2013-02-16 MED ORDER — TETANUS-DIPHTH-ACELL PERTUSSIS 5-2.5-18.5 LF-MCG/0.5 IM SUSP
0.5000 mL | Freq: Once | INTRAMUSCULAR | Status: AC
  Administered 2013-02-16: 0.5 mL via INTRAMUSCULAR
  Filled 2013-02-16: qty 0.5
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
## 2013-02-16 NOTE — ED Notes (Signed)
MD at bedside. 
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
## 2013-02-16 NOTE — ED Notes (Signed)
Bleeding not well controlled.  Pressure applied.  Pt accepting although it is painful.  Physician preparing to instill lidocaine.
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
## 2013-02-17 NOTE — ED Provider Notes (Signed)
I have personally seen and examined the patient.  I have discussed the plan of care with the resident.  I have reviewed the documentation on PMH/FH/Soc. History.  I have reviewed the documentation of the resident and agree.  Hester Joslin, MD 02/17/13 1736 

## 2013-02-20 ENCOUNTER — Encounter (HOSPITAL_COMMUNITY): Payer: Self-pay | Admitting: Emergency Medicine

## 2013-02-20 ENCOUNTER — Emergency Department (HOSPITAL_COMMUNITY)
Admission: EM | Admit: 2013-02-20 | Discharge: 2013-02-20 | Disposition: A | Discharge status: Home / Self Care | Payer: Self-pay | Attending: Emergency Medicine | Admitting: Emergency Medicine

## 2013-02-20 DIAGNOSIS — Z880 Allergy status to penicillin: Secondary | ICD-10-CM | POA: Insufficient documentation

## 2013-02-20 DIAGNOSIS — S41111S Laceration without foreign body of right upper arm, sequela: Secondary | ICD-10-CM

## 2013-02-20 DIAGNOSIS — Z9889 Other specified postprocedural states: Secondary | ICD-10-CM | POA: Insufficient documentation

## 2013-02-20 DIAGNOSIS — Z4800 Encounter for change or removal of nonsurgical wound dressing: Secondary | ICD-10-CM | POA: Insufficient documentation

## 2013-02-20 DIAGNOSIS — Z872 Personal history of diseases of the skin and subcutaneous tissue: Secondary | ICD-10-CM | POA: Insufficient documentation

## 2013-02-20 NOTE — ED Provider Notes (Signed)
Medical screening examination/treatment/procedure(s) were performed by non-physician practitioner and as supervising physician I was immediately available for consultation/collaboration.    Cherilyn Sautter R Esthefany Herrig, MD 02/20/13 1613 
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
## 2013-02-20 NOTE — ED Notes (Signed)
Onset 4 days ago right forearm laceration had surgery and was told to go to ED for recheck 3-4 days.

## 2013-02-27 ENCOUNTER — Encounter (HOSPITAL_COMMUNITY): Payer: Self-pay | Admitting: Emergency Medicine

## 2013-02-27 ENCOUNTER — Emergency Department (HOSPITAL_COMMUNITY)
Admission: EM | Admit: 2013-02-27 | Discharge: 2013-02-27 | Disposition: A | Discharge status: Home / Self Care | Payer: Self-pay | Attending: Emergency Medicine | Admitting: Emergency Medicine

## 2013-02-27 DIAGNOSIS — Z872 Personal history of diseases of the skin and subcutaneous tissue: Secondary | ICD-10-CM | POA: Insufficient documentation

## 2013-02-27 DIAGNOSIS — Z4802 Encounter for removal of sutures: Secondary | ICD-10-CM | POA: Insufficient documentation

## 2013-02-27 DIAGNOSIS — Z880 Allergy status to penicillin: Secondary | ICD-10-CM | POA: Insufficient documentation
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
## 2013-02-28 NOTE — ED Provider Notes (Signed)
Medical screening examination/treatment/procedure(s) were performed by non-physician practitioner and as supervising physician I was immediately available for consultation/collaboration.   Enid Skeens, MD 02/28/13 [phone]

## 2013-03-10 ENCOUNTER — Emergency Department (INDEPENDENT_AMBULATORY_CARE_PROVIDER_SITE_OTHER)
Admission: EM | Admit: 2013-03-10 | Discharge: 2013-03-10 | Disposition: A | Discharge status: Home / Self Care | Payer: Self-pay | Source: Home / Self Care | Attending: Emergency Medicine | Admitting: Emergency Medicine

## 2013-03-10 ENCOUNTER — Encounter (HOSPITAL_COMMUNITY): Payer: Self-pay | Admitting: Emergency Medicine

## 2013-03-10 DIAGNOSIS — L299 Pruritus, unspecified: Secondary | ICD-10-CM

## 2013-03-10 DIAGNOSIS — L309 Dermatitis, unspecified: Secondary | ICD-10-CM

## 2013-03-10 DIAGNOSIS — L259 Unspecified contact dermatitis, unspecified cause: Secondary | ICD-10-CM

## 2013-03-10 MED ORDER — PRAMOXINE HCL 1 % EX LOTN: 1.0000 "application " | TOPICAL_LOTION | Freq: Two times a day (BID) | CUTANEOUS | Status: DC

## 2013-03-10 MED ORDER — METHYLPREDNISOLONE SODIUM SUCC 125 MG IJ SOLR
INTRAMUSCULAR | Status: AC
  Filled 2013-03-10: qty 2

## 2013-03-10 MED ORDER — TRIAMCINOLONE ACETONIDE 0.1 % EX LOTN: TOPICAL_LOTION | Freq: Three times a day (TID) | CUTANEOUS | Status: DC

## 2013-03-10 MED ORDER — METHYLPREDNISOLONE SODIUM SUCC 125 MG IJ SOLR
125.0000 mg | Freq: Once | INTRAMUSCULAR | Status: AC
  Administered 2013-03-10: 125 mg via INTRAMUSCULAR

## 2013-03-10 MED ORDER — HYDROXYZINE HCL 25 MG PO TABS
ORAL_TABLET | ORAL | Status: DC
Start: 2013-03-10 — End: 2014-12-05

## 2013-03-10 NOTE — ED Notes (Signed)
Patient reports rash/patches covering entire body-eczema.

## 2013-03-10 NOTE — ED Provider Notes (Signed)
Medical screening examination/treatment/procedure(s) were performed by non-physician practitioner and as supervising physician I was immediately available for consultation/collaboration.  Leslee Home, M.D.  Reuben Likes, MD 03/10/13 2119
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
## 2013-03-21 MED ORDER — TRIAMCINOLONE ACETONIDE 0.1 % EX OINT: 1.0000 "application " | TOPICAL_OINTMENT | Freq: Three times a day (TID) | CUTANEOUS | Status: DC

## 2014-04-10 ENCOUNTER — Encounter (HOSPITAL_COMMUNITY): Payer: Self-pay | Admitting: Emergency Medicine

## 2014-04-10 ENCOUNTER — Emergency Department (HOSPITAL_COMMUNITY)
Admission: EM | Admit: 2014-04-10 | Discharge: 2014-04-11 | Disposition: A | Discharge status: Home / Self Care | Payer: Self-pay | Attending: Emergency Medicine | Admitting: Emergency Medicine

## 2014-04-10 DIAGNOSIS — Z720 Tobacco use: Secondary | ICD-10-CM | POA: Insufficient documentation

## 2014-04-10 DIAGNOSIS — Z3202 Encounter for pregnancy test, result negative: Secondary | ICD-10-CM | POA: Insufficient documentation

## 2014-04-10 DIAGNOSIS — R519 Headache, unspecified: Secondary | ICD-10-CM

## 2014-04-10 DIAGNOSIS — Z880 Allergy status to penicillin: Secondary | ICD-10-CM | POA: Insufficient documentation

## 2014-04-10 DIAGNOSIS — R5383 Other fatigue: Secondary | ICD-10-CM | POA: Insufficient documentation

## 2014-04-10 DIAGNOSIS — J309 Allergic rhinitis, unspecified: Secondary | ICD-10-CM | POA: Insufficient documentation

## 2014-04-10 DIAGNOSIS — R51 Headache: Secondary | ICD-10-CM | POA: Insufficient documentation

## 2014-04-10 DIAGNOSIS — Z872 Personal history of diseases of the skin and subcutaneous tissue: Secondary | ICD-10-CM | POA: Insufficient documentation

## 2014-04-10 DIAGNOSIS — Z79899 Other long term (current) drug therapy: Secondary | ICD-10-CM | POA: Insufficient documentation

## 2014-04-10 DIAGNOSIS — J209 Acute bronchitis, unspecified: Secondary | ICD-10-CM | POA: Insufficient documentation

## 2014-04-10 HISTORY — DX: Bronchitis, not specified as acute or chronic: J40

## 2014-04-10 LAB — COMPREHENSIVE METABOLIC PANEL
ALK PHOS: 73 U/L (ref 39–117)
ALT: 9 U/L (ref 0–35)
AST: 19 U/L (ref 0–37)
Albumin: 4.2 g/dL (ref 3.5–5.2)
Anion gap: 13 (ref 5–15)
BILIRUBIN TOTAL: 0.2 mg/dL — AB (ref 0.3–1.2)
BUN: 7 mg/dL (ref 6–23)
CHLORIDE: 99 meq/L (ref 96–112)
CO2: 23 meq/L (ref 19–32)
Calcium: 9.3 mg/dL (ref 8.4–10.5)
Creatinine, Ser: 0.75 mg/dL (ref 0.50–1.10)
GFR calc non Af Amer: >90 mL/min (ref >90)
Glucose, Bld: 89 mg/dL (ref 70–99)
Potassium: 3.6 mEq/L — ABNORMAL LOW (ref 3.7–5.3)
SODIUM: 135 meq/L — AB (ref 137–147)
Total Protein: 8 g/dL (ref 6.0–8.3)

## 2014-04-10 LAB — CBC WITH DIFFERENTIAL/PLATELET
BASOS ABS: 0 10*3/uL (ref 0.0–0.1)
Basophils Relative: 1 % (ref 0–1)
Eosinophils Absolute: 0.2 10*3/uL (ref 0.0–0.7)
Eosinophils Relative: 3 % (ref 0–5)
HEMATOCRIT: 32.4 % — AB (ref 36.0–46.0)
Hemoglobin: 10.7 g/dL — ABNORMAL LOW (ref 12.0–15.0)
LYMPHS ABS: 2.5 10*3/uL (ref 0.7–4.0)
LYMPHS PCT: 40 % (ref 12–46)
MCH: 25.5 pg — ABNORMAL LOW (ref 26.0–34.0)
MCHC: 33 g/dL (ref 30.0–36.0)
MCV: 77.3 fL — ABNORMAL LOW (ref 78.0–100.0)
Monocytes Absolute: 0.7 10*3/uL (ref 0.1–1.0)
Monocytes Relative: 11 % (ref 3–12)
Neutro Abs: 2.9 10*3/uL (ref 1.7–7.7)
Neutrophils Relative %: 45 % (ref 43–77)
PLATELETS: 447 10*3/uL — AB (ref 150–400)
RBC: 4.19 MIL/uL (ref 3.87–5.11)
RDW: 16.1 % — ABNORMAL HIGH (ref 11.5–15.5)
WBC: 6.4 10*3/uL (ref 4.0–10.5)

## 2014-04-10 LAB — POC URINE PREG, ED: PREG TEST UR: NEGATIVE

## 2014-04-10 MED ORDER — IBUPROFEN 200 MG PO TABS
400.0000 mg | ORAL_TABLET | Freq: Once | ORAL | Status: AC
  Administered 2014-04-10: 400 mg via ORAL
  Filled 2014-04-10: qty 2

## 2014-04-10 MED ORDER — METOCLOPRAMIDE HCL 5 MG/ML IJ SOLN: 10.0000 mg | Freq: Once | INTRAMUSCULAR | Status: DC

## 2014-04-10 MED ORDER — DIPHENHYDRAMINE HCL 50 MG/ML IJ SOLN
50.0000 mg | Freq: Once | INTRAMUSCULAR | Status: AC
  Administered 2014-04-10: 50 mg via INTRAMUSCULAR
  Filled 2014-04-10: qty 1

## 2014-04-10 MED ORDER — METOCLOPRAMIDE HCL 5 MG/ML IJ SOLN
10.0000 mg | Freq: Once | INTRAMUSCULAR | Status: AC
  Administered 2014-04-10: 10 mg via INTRAMUSCULAR
  Filled 2014-04-10: qty 2
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
## 2014-04-10 NOTE — ED Notes (Signed)
Discussed with the patient that urine sample is needed.

## 2014-04-11 LAB — URINALYSIS, ROUTINE W REFLEX MICROSCOPIC
BILIRUBIN URINE: NEGATIVE
GLUCOSE, UA: NEGATIVE mg/dL
HGB URINE DIPSTICK: NEGATIVE
KETONES UR: NEGATIVE mg/dL
Nitrite: NEGATIVE
Protein, ur: NEGATIVE mg/dL
Specific Gravity, Urine: 1.011 (ref 1.005–1.030)
UROBILINOGEN UA: 0.2 mg/dL (ref 0.0–1.0)
pH: 5.5 (ref 5.0–8.0)

## 2014-04-11 LAB — URINE MICROSCOPIC-ADD ON

## 2014-04-11 MED ORDER — TRAMADOL HCL 50 MG PO TABS
50.0000 mg | ORAL_TABLET | Freq: Four times a day (QID) | ORAL | Status: DC | PRN
Start: 2014-04-11 — End: 2016-02-23

## 2014-04-11 MED ORDER — CETIRIZINE HCL 10 MG PO TABS: 10.0000 mg | ORAL_TABLET | Freq: Every day | ORAL | Status: DC

## 2014-04-11 NOTE — ED Notes (Signed)
Reported to De Tour Villageatyana, PA-C, that urinalysis has resulted, she acknowledges. No new orders.

## 2014-04-11 NOTE — Discharge Instructions (Signed)
Zyrtec for allergy like symptoms. Ultram for severe pain. Ibuprofen for minor pain. Get rid of your mold. Follow up with primary care doctor.    Allergies Allergies may happen from anything your body is sensitive to. This may be food, medicines, pollens, chemicals, and nearly anything around you in everyday life that produces allergens. An allergen is anything that causes an allergy producing substance. Heredity is often a factor in causing these problems. This means you may have some of the same allergies as your parents. Food allergies happen in all age groups. Food allergies are some of the most severe and life threatening. Some common food allergies are cow's milk, seafood, eggs, nuts, wheat, and soybeans. SYMPTOMS   Swelling around the mouth.  An itchy red rash or hives.  Vomiting or diarrhea.  Difficulty breathing. SEVERE ALLERGIC REACTIONS ARE LIFE-THREATENING. This reaction is called anaphylaxis. It can cause the mouth and throat to swell and cause difficulty with breathing and swallowing. In severe reactions only a trace amount of food (for example, peanut oil in a salad) may cause death within seconds. Seasonal allergies occur in all age groups. These are seasonal because they usually occur during the same season every year. They may be a reaction to molds, grass pollens, or tree pollens. Other causes of problems are house dust mite allergens, pet dander, and mold spores. The symptoms often consist of nasal congestion, a runny itchy nose associated with sneezing, and tearing itchy eyes. There is often an associated itching of the mouth and ears. The problems happen when you come in contact with pollens and other allergens. Allergens are the particles in the air that the body reacts to with an allergic reaction. This causes you to release allergic antibodies. Through a chain of events, these eventually cause you to release histamine into the blood stream. Although it is meant to be  protective to the body, it is this release that causes your discomfort. This is why you were given anti-histamines to feel better. If you are unable to pinpoint the offending allergen, it may be determined by skin or blood testing. Allergies cannot be cured but can be controlled with medicine. Hay fever is a collection of all or some of the seasonal allergy problems. It may often be treated with simple over-the-counter medicine such as diphenhydramine. Take medicine as directed. Do not drink alcohol or drive while taking this medicine. Check with your caregiver or package insert for child dosages. If these medicines are not effective, there are many new medicines your caregiver can prescribe. Stronger medicine such as nasal spray, eye drops, and corticosteroids may be used if the first things you try do not work well. Other treatments such as immunotherapy or desensitizing injections can be used if all else fails. Follow up with your caregiver if problems continue. These seasonal allergies are usually not life threatening. They are generally more of a nuisance that can often be handled using medicine. HOME CARE INSTRUCTIONS   If unsure what causes a reaction, keep a diary of foods eaten and symptoms that follow. Avoid foods that cause reactions.  If hives or rash are present:  Take medicine as directed.  You may use an over-the-counter antihistamine (diphenhydramine) for hives and itching as needed.  Apply cold compresses (cloths) to the skin or take baths in cool water. Avoid hot baths or showers. Heat will make a rash and itching worse.  If you are severely allergic:  Following a treatment for a severe reaction, hospitalization is often  required for closer follow-up.  Wear a medic-alert bracelet or necklace stating the allergy.  You and your family must learn how to give adrenaline or use an anaphylaxis kit.  If you have had a severe reaction, always carry your anaphylaxis kit or EpiPen  with you. Use this medicine as directed by your caregiver if a severe reaction is occurring. Failure to do so could have a fatal outcome. SEEK MEDICAL CARE IF:  You suspect a food allergy. Symptoms generally happen within 30 minutes of eating a food.  Your symptoms have not gone away within 2 days or are getting worse.  You develop new symptoms.  You want to retest yourself or your child with a food or drink you think causes an allergic reaction. Never do this if an anaphylactic reaction to that food or drink has happened before. Only do this under the care of a caregiver. SEEK IMMEDIATE MEDICAL CARE IF:   You have difficulty breathing, are wheezing, or have a tight feeling in your chest or throat.  You have a swollen mouth, or you have hives, swelling, or itching all over your body.  You have had a severe reaction that has responded to your anaphylaxis kit or an EpiPen. These reactions may return when the medicine has worn off. These reactions should be considered life threatening. MAKE SURE YOU:   Understand these instructions.  Will watch your condition.  Will get help right away if you are not doing well or get worse. Document Released: 07/27/2002 Document Revised: 08/28/2012 Document Reviewed: 01/01/2008 [facility] Patient Information 2015 [place], [place]. This information is not intended to replace advice given to you by your health care provider. Make sure you discuss any questions you have with your health care provider.   General Headache Without Cause A headache is pain or discomfort felt around the head or neck area. The specific cause of a headache may not be found. There are many causes and types of headaches. A few common ones are:  Tension headaches.  Migraine headaches.  Cluster headaches.  Chronic daily headaches. HOME CARE INSTRUCTIONS   Keep all follow-up appointments with your caregiver or any specialist referral.  Only take over-the-counter or  prescription medicines for pain or discomfort as directed by your caregiver.  Lie down in a dark, quiet room when you have a headache.  Keep a headache journal to find out what may trigger your migraine headaches. For example, write down:  What you eat and drink.  How much sleep you get.  Any change to your diet or medicines.  Try massage or other relaxation techniques.  Put ice packs or heat on the head and neck. Use these 3 to 4 times per day for 15 to 20 minutes each time, or as needed.  Limit stress.  Sit up straight, and do not tense your muscles.  Quit smoking if you smoke.  Limit alcohol use.  Decrease the amount of caffeine you drink, or stop drinking caffeine.  Eat and sleep on a regular schedule.  Get 7 to 9 hours of sleep, or as recommended by your caregiver.  Keep lights dim if bright lights bother you and make your headaches worse. SEEK MEDICAL CARE IF:   You have problems with the medicines you were prescribed.  Your medicines are not working.  You have a change from the usual headache.  You have nausea or vomiting. SEEK IMMEDIATE MEDICAL CARE IF:   Your headache becomes severe.  You have a fever.  You  have a stiff neck.  You have loss of vision.  You have muscular weakness or loss of muscle control.  You start losing your balance or have trouble walking.  You feel faint or pass out.  You have severe symptoms that are different from your first symptoms. MAKE SURE YOU:   Understand these instructions.  Will watch your condition.  Will get help right away if you are not doing well or get worse. Document Released: 05/03/2005 Document Revised: 07/26/2011 Document Reviewed: 05/19/2011 [facility] Patient Information 2015 [place], [place]. This information is not intended to replace advice given to you by your health care provider. Make sure you discuss any questions you have with your health care provider.

## 2014-04-11 NOTE — ED Notes (Signed)
Discussed with Tatyana, PA-C, that pregnancy test was negative. She acknowledges.

## 2014-12-04 ENCOUNTER — Encounter (HOSPITAL_COMMUNITY): Payer: Self-pay

## 2014-12-04 DIAGNOSIS — Z79899 Other long term (current) drug therapy: Secondary | ICD-10-CM | POA: Insufficient documentation

## 2014-12-04 DIAGNOSIS — Z8709 Personal history of other diseases of the respiratory system: Secondary | ICD-10-CM | POA: Insufficient documentation

## 2014-12-04 DIAGNOSIS — Z880 Allergy status to penicillin: Secondary | ICD-10-CM | POA: Insufficient documentation

## 2014-12-04 DIAGNOSIS — L309 Dermatitis, unspecified: Secondary | ICD-10-CM | POA: Insufficient documentation

## 2014-12-04 DIAGNOSIS — Z720 Tobacco use: Secondary | ICD-10-CM | POA: Insufficient documentation

## 2014-12-04 NOTE — ED Notes (Signed)
Pt reporting a break out of Eczema, onset about 4 days ago, rash noted to neck, face, arms.

## 2014-12-05 ENCOUNTER — Emergency Department (HOSPITAL_COMMUNITY)
Admission: EM | Admit: 2014-12-05 | Discharge: 2014-12-05 | Disposition: A | Discharge status: Home / Self Care | Payer: Self-pay | Attending: Emergency Medicine | Admitting: Emergency Medicine

## 2014-12-05 DIAGNOSIS — L309 Dermatitis, unspecified: Secondary | ICD-10-CM

## 2014-12-05 MED ORDER — PREDNISONE 10 MG (21) PO TBPK: 10.0000 mg | ORAL_TABLET | Freq: Every day | ORAL | Status: DC

## 2014-12-05 MED ORDER — LORATADINE 10 MG PO TABS: 10.0000 mg | ORAL_TABLET | Freq: Every day | ORAL | Status: DC

## 2014-12-05 MED ORDER — DEXAMETHASONE SODIUM PHOSPHATE 10 MG/ML IJ SOLN
6.0000 mg | Freq: Once | INTRAMUSCULAR | Status: AC
  Administered 2014-12-05: 6 mg via INTRAMUSCULAR
  Filled 2014-12-05: qty 1

## 2014-12-05 MED ORDER — DIPHENHYDRAMINE HCL 25 MG PO CAPS
25.0000 mg | ORAL_CAPSULE | Freq: Once | ORAL | Status: AC
  Administered 2014-12-05: 25 mg via ORAL
  Filled 2014-12-05: qty 1

## 2014-12-05 MED ORDER — TRIAMCINOLONE ACETONIDE 0.025 % EX OINT: 1.0000 "application " | TOPICAL_OINTMENT | Freq: Two times a day (BID) | CUTANEOUS | Status: DC

## 2014-12-05 MED ORDER — HYDROXYZINE HCL 25 MG PO TABS: 25.0000 mg | ORAL_TABLET | Freq: Four times a day (QID) | ORAL | Status: DC

## 2014-12-05 NOTE — Discharge Instructions (Signed)
Eczema Eczema, also called atopic dermatitis, is a skin disorder that causes inflammation of the skin. It causes a red rash and dry, scaly skin. The skin becomes very itchy. Eczema is generally worse during the cooler winter months and often improves with the warmth of summer. Eczema usually starts showing signs in infancy. Some children outgrow eczema, but it may last through adulthood.  CAUSES  The exact cause of eczema is not known, but it appears to run in families. People with eczema often have a family history of eczema, allergies, asthma, or hay fever. Eczema is not contagious. Flare-ups of the condition may be caused by:   Contact with something you are sensitive or allergic to.   Stress. SIGNS AND SYMPTOMS  Dry, scaly skin.   Red, itchy rash.   Itchiness. This may occur before the skin rash and may be very intense.  DIAGNOSIS  The diagnosis of eczema is usually made based on symptoms and medical history. TREATMENT  Eczema cannot be cured, but symptoms usually can be controlled with treatment and other strategies. A treatment plan might include:  Controlling the itching and scratching.   Use over-the-counter antihistamines as directed for itching. This is especially useful at night when the itching tends to be worse.   Use over-the-counter steroid creams as directed for itching.   Avoid scratching. Scratching makes the rash and itching worse. It may also result in a skin infection (impetigo) due to a break in the skin caused by scratching.   Keeping the skin well moisturized with creams every day. This will seal in moisture and help prevent dryness. Lotions that contain alcohol and water should be avoided because they can dry the skin.   Limiting exposure to things that you are sensitive or allergic to (allergens).   Recognizing situations that cause stress.   Developing a plan to manage stress.  HOME CARE INSTRUCTIONS   Only take over-the-counter or  prescription medicines as directed by your health care provider.   Do not use anything on the skin without checking with your health care provider.   Keep baths or showers short (5 minutes) in warm (not hot) water. Use mild cleansers for bathing. These should be unscented. You may add nonperfumed bath oil to the bath water. It is best to avoid soap and bubble bath.   Immediately after a bath or shower, when the skin is still damp, apply a moisturizing ointment to the entire body. This ointment should be a petroleum ointment. This will seal in moisture and help prevent dryness. The thicker the ointment, the better. These should be unscented.   Keep fingernails cut short. Children with eczema may need to wear soft gloves or mittens at night after applying an ointment.   Dress in clothes made of cotton or cotton blends. Dress lightly, because heat increases itching.   A child with eczema should stay away from anyone with fever blisters or cold sores. The virus that causes fever blisters (herpes simplex) can cause a serious skin infection in children with eczema. SEEK MEDICAL CARE IF:   Your itching interferes with sleep.   Your rash gets worse or is not better within 1 week after starting treatment.   You see pus or soft yellow scabs in the rash area.   You have a fever.   You have a rash flare-up after contact with someone who has fever blisters.  Document Released: 04/30/2000 Document Revised: 02/21/2013 Document Reviewed: 12/04/2012 [employer] Patient Information 2015 [employer]. This information   is not intended to replace advice given to you by your health care provider. Make sure you discuss any questions you have with your health care provider.  
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
## 2015-01-27 ENCOUNTER — Encounter (HOSPITAL_COMMUNITY): Payer: Self-pay | Admitting: Emergency Medicine

## 2015-01-27 ENCOUNTER — Emergency Department (HOSPITAL_COMMUNITY)
Admission: EM | Admit: 2015-01-27 | Discharge: 2015-01-27 | Disposition: A | Discharge status: Home / Self Care | Payer: Self-pay | Attending: Physician Assistant | Admitting: Physician Assistant

## 2015-01-27 DIAGNOSIS — Z880 Allergy status to penicillin: Secondary | ICD-10-CM | POA: Insufficient documentation

## 2015-01-27 DIAGNOSIS — Z8709 Personal history of other diseases of the respiratory system: Secondary | ICD-10-CM | POA: Insufficient documentation

## 2015-01-27 DIAGNOSIS — Z720 Tobacco use: Secondary | ICD-10-CM | POA: Insufficient documentation

## 2015-01-27 DIAGNOSIS — Z872 Personal history of diseases of the skin and subcutaneous tissue: Secondary | ICD-10-CM | POA: Insufficient documentation

## 2015-01-27 DIAGNOSIS — Z79899 Other long term (current) drug therapy: Secondary | ICD-10-CM | POA: Insufficient documentation

## 2015-01-27 DIAGNOSIS — K029 Dental caries, unspecified: Secondary | ICD-10-CM | POA: Insufficient documentation

## 2015-01-27 DIAGNOSIS — Z7952 Long term (current) use of systemic steroids: Secondary | ICD-10-CM | POA: Insufficient documentation

## 2015-01-27 MED ORDER — NAPROXEN 500 MG PO TABS: 500.0000 mg | ORAL_TABLET | Freq: Two times a day (BID) | ORAL | Status: DC | PRN

## 2015-01-27 MED ORDER — HYDROCODONE-ACETAMINOPHEN 5-325 MG PO TABS: 1.0000 | ORAL_TABLET | Freq: Once | ORAL | Status: DC

## 2015-01-27 MED ORDER — HYDROCODONE-ACETAMINOPHEN 5-325 MG PO TABS: 1.0000 | ORAL_TABLET | Freq: Four times a day (QID) | ORAL | Status: DC | PRN
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
## 2015-01-27 NOTE — Discharge Instructions (Signed)
Apply warm compresses to jaw throughout the day. Take naprosyn and norco as directed, as needed for pain but do not drive or operate machinery with pain medication use. Followup with a dentist is very important for ongoing evaluation and management of recurrent dental pain. Stop smoking! Call the dentist listed above in the next 24-48 hours to try to get an appointment, or use the list below to find a dentist. Return to emergency department for emergent changing or worsening symptoms.   Dental Caries Dental caries is tooth decay. This decay can cause a hole in teeth (cavity) that can get bigger and deeper over time. HOME CARE  Brush and floss your teeth. Do this at least two times a day.  Use a fluoride toothpaste.  Use a mouth rinse if told by your dentist or doctor.  Eat less sugary and starchy foods. Drink less sugary drinks.  Avoid snacking often on sugary and starchy foods. Avoid sipping often on sugary drinks.  Keep regular checkups and cleanings with your dentist.  Use fluoride supplements if told by your dentist or doctor.  Allow fluoride to be applied to teeth if told by your dentist or doctor. Document Released: 02/10/2008 Document Revised: 09/17/2013 Document Reviewed: 05/05/2012 [facility] Patient Information 2015 [place], [place]. This information is not intended to replace advice given to you by your health care provider. Make sure you discuss any questions you have with your health care provider.  Dental Pain A tooth ache may be caused by cavities (tooth decay). Cavities expose the nerve of the tooth to air and hot or cold temperatures. It may come from an infection or abscess (also called a boil or furuncle) around your tooth. It is also often caused by dental caries (tooth decay). This causes the pain you are having. DIAGNOSIS  Your caregiver can diagnose this problem by exam. TREATMENT   If caused by an infection, it may be treated with medications which kill germs  (antibiotics) and pain medications as prescribed by your caregiver. Take medications as directed.  Only take over-the-counter or prescription medicines for pain, discomfort, or fever as directed by your caregiver.  Whether the tooth ache today is caused by infection or dental disease, you should see your dentist as soon as possible for further care. SEEK MEDICAL CARE IF: The exam and treatment you received today has been provided on an emergency basis only. This is not a substitute for complete medical or dental care. If your problem worsens or new problems (symptoms) appear, and you are unable to meet with your dentist, call or return to this location. SEEK IMMEDIATE MEDICAL CARE IF:   You have a fever.  You develop redness and swelling of your face, jaw, or neck.  You are unable to open your mouth.  You have severe pain uncontrolled by pain medicine. MAKE SURE YOU:   Understand these instructions.  Will watch your condition.  Will get help right away if you are not doing well or get worse. Document Released: 05/03/2005 Document Revised: 07/26/2011 Document Reviewed: 12/20/2007 [facility] Patient Information 2015 [place], [place]. This information is not intended to replace advice given to you by your health care provider. Make sure you discuss any questions you have with your health care provider.  Smoking Cessation, Tips for Success If you are ready to quit smoking, congratulations! You have chosen to help yourself be healthier. Cigarettes bring nicotine, tar, carbon monoxide, and other irritants into your body. Your lungs, heart, and blood vessels will be able to  work better without these poisons. There are many different ways to quit smoking. Nicotine gum, nicotine patches, a nicotine inhaler, or nicotine nasal spray can help with physical craving. Hypnosis, support groups, and medicines help break the habit of smoking. WHAT THINGS CAN I DO TO MAKE QUITTING EASIER?  Here are some tips  to help you quit for good:  Pick a date when you will quit smoking completely. Tell all of your friends and family about your plan to quit on that date.  Do not try to slowly cut down on the number of cigarettes you are smoking. Pick a quit date and quit smoking completely starting on that day.  Throw away all cigarettes.   Clean and remove all ashtrays from your home, work, and car.  On a card, write down your reasons for quitting. Carry the card with you and read it when you get the urge to smoke.  Cleanse your body of nicotine. Drink enough water and fluids to keep your urine clear or pale yellow. Do this after quitting to flush the nicotine from your body.  Learn to predict your moods. Do not let a bad situation be your excuse to have a cigarette. Some situations in your life might tempt you into wanting a cigarette.  Never have "just one" cigarette. It leads to wanting another and another. Remind yourself of your decision to quit.  Change habits associated with smoking. If you smoked while driving or when feeling stressed, try other activities to replace smoking. Stand up when drinking your coffee. Brush your teeth after eating. Sit in a different chair when you read the paper. Avoid alcohol while trying to quit, and try to drink fewer caffeinated beverages. Alcohol and caffeine may urge you to smoke.  Avoid foods and drinks that can trigger a desire to smoke, such as sugary or spicy foods and alcohol.  Ask people who smoke not to smoke around you.  Have something planned to do right after eating or having a cup of coffee. For example, plan to take a walk or exercise.  Try a relaxation exercise to calm you down and decrease your stress. Remember, you may be tense and nervous for the first 2 weeks after you quit, but this will pass.  Find new activities to keep your hands busy. Play with a pen, coin, or rubber band. Doodle or draw things on paper.  Brush your teeth right after  eating. This will help cut down on the craving for the taste of tobacco after meals. You can also try mouthwash.   Use oral substitutes in place of cigarettes. Try using lemon drops, carrots, cinnamon sticks, or chewing gum. Keep them handy so they are available when you have the urge to smoke.  When you have the urge to smoke, try deep breathing.  Designate your home as a nonsmoking area.  If you are a heavy smoker, ask your health care provider about a prescription for nicotine chewing gum. It can ease your withdrawal from nicotine.  Reward yourself. Set aside the cigarette money you save and buy yourself something nice.  Look for support from others. Join a support group or smoking cessation program. Ask someone at home or at work to help you with your plan to quit smoking.  Always ask yourself, "Do I need this cigarette or is this just a reflex?" Tell yourself, "Today, I choose not to smoke," or "I do not want to smoke." You are reminding yourself of your decision to quit.  Do not replace cigarette smoking with electronic cigarettes (commonly called e-cigarettes). The safety of e-cigarettes is unknown, and some may contain harmful chemicals.  If you relapse, do not give up! Plan ahead and think about what you will do the next time you get the urge to smoke. HOW WILL I FEEL WHEN I QUIT SMOKING? You may have symptoms of withdrawal because your body is used to nicotine (the addictive substance in cigarettes). You may crave cigarettes, be irritable, feel very hungry, cough often, get headaches, or have difficulty concentrating. The withdrawal symptoms are only temporary. They are strongest when you first quit but will go away within 10-14 days. When withdrawal symptoms occur, stay in control. Think about your reasons for quitting. Remind yourself that these are signs that your body is healing and getting used to being without cigarettes. Remember that withdrawal symptoms are easier to treat than  the major diseases that smoking can cause.  Even after the withdrawal is over, expect periodic urges to smoke. However, these cravings are generally short lived and will go away whether you smoke or not. Do not smoke! WHAT RESOURCES ARE AVAILABLE TO HELP ME QUIT SMOKING? Your health care provider can direct you to community resources or hospitals for support, which may include:  Group support.  Education.  Hypnosis.  Therapy. Document Released: 01/30/2004 Document Revised: 09/17/2013 Document Reviewed: 10/19/2012 [facility] Patient Information 2015 [place], [place]. This information is not intended to replace advice given to you by your health care provider. Make sure you discuss any questions you have with your health care provider.    Emergency Department Resource Guide 1) Find a Doctor and Pay Out of Pocket Although you won't have to find out who is covered by your insurance plan, it is a good idea to ask around and get recommendations. You will then need to call the office and see if the doctor you have chosen will accept you as a new patient and what types of options they offer for patients who are self-pay. Some doctors offer discounts or will set up payment plans for their patients who do not have insurance, but you will need to ask so you aren't surprised when you get to your appointment.  2) Contact Your Local Health Department Not all health departments have doctors that can see patients for sick visits, but many do, so it is worth a call to see if yours does. If you don't know where your local health department is, you can check in your phone book. The CDC also has a tool to help you locate your state's health department, and many state websites also have listings of all of their local health departments.  3) Find a Walk-in Clinic If your illness is not likely to be very severe or complicated, you may want to try a walk in clinic. These are popping up all over the country in  pharmacies, drugstores, and shopping centers. They're usually staffed by nurse practitioners or physician assistants that have been trained to treat common illnesses and complaints. They're usually fairly quick and inexpensive. However, if you have serious medical issues or chronic medical problems, these are probably not your best option.  No Primary Care Doctor: - Call Health Connect at  [phone] - they can help you locate a primary care doctor that  accepts your insurance, provides certain services, etc. - Physician Referral Service- [phone]  Chronic Pain Problems: Organization         Address  Phone   Notes  [facility]  [phone] Patients need to be referred by their primary care doctor.   Medication Assistance: Organization         Address  Phone   Notes  [facility] Medication [facility] [address] [place] [place], [place] [address] [phone] --Must be a resident of [facility] -- Must have NO insurance coverage whatsoever (no Medicaid/ Medicare, etc.) -- The pt. MUST have a primary care doctor that directs their care regularly and follows them in the community   [facility]  [phone]   [place]  [phone]     Dental Care: Organization         Address  Phone  Notes  [facility] Department of [facility] [address] [place], [place] [phone] Accepts children up to age 64 who are enrolled in [place] or [place] Health Choice; pregnant women with a Medicaid card; and children who have applied for Medicaid or [place] Health Choice, but were declined, whose parents can pay a reduced fee at time of service.  [facility] Department of [facility]  [address], [place] [phone] Accepts children up to age 27 who are enrolled in [place] or [place] Health Choice; pregnant women with a Medicaid card; and children who have applied for Medicaid or  Health Choice, but  were declined, whose parents can pay a reduced fee at time of service.  [facility]  [address] [place], [place] [phone] Patients are seen by appointment only. Walk-ins are not accepted. [facility] will see patients 52 years of age and older. Monday - Tuesday (8am-5pm) Most Wednesdays (8:30-5pm) $30 per visit, cash only  [facility]  [address], [facility] [phone] Patients are seen by appointment only. Walk-ins are not accepted. [facility] will see patients 56 years of age and older. One Wednesday Evening (Monthly: Volunteer Based).  $30 per visit, cash only  [employer]  [phone] for adults; Children under age 19, call Graduate Pediatric Dentistry at [phone]. Children aged 39-14, please call [phone] to request a pediatric application.  Dental services are provided in all areas of dental care including fillings, crowns and bridges, complete and partial dentures, implants, gum treatment, root canals, and extractions. Preventive care is also provided. Treatment is provided to both adults and children. Patients are selected via a lottery and there is often a waiting list.   [facility] [address], [place]  [phone] [email or web address]   [facility] [address] [place], [place] [phone], Ext. [phone] Second and Fourth Thursday of each month, opens at 6:30 AM; Clinic ends at 9 AM.  Patients are seen on a first-come first-served basis, and a limited number are seen during each clinic.   [facility]  [address] [place], [place] [phone]   Eligibility Requirements You must have lived in [place], [place], or [place] counties for at least the last three months.   You cannot be eligible for state or federal sponsored [place], including [employer], [place], or [employer].   You generally  cannot be eligible for healthcare insurance through your employer.    How to apply: Eligibility screenings are held every Tuesday and Wednesday afternoon from 1:00 pm until 4:00 pm. You do not need an appointment for the interview!  [facility]  Dental Clinic [address], [place], [place] [phone]   [facility] Health Department  [phone]   [facility] Health Department  [phone]   [facility] Health Department  [phone]

## 2015-01-27 NOTE — ED Notes (Signed)
Patient here for right upper dental pain ongoing since last week.  BC powder is the only medication taken for the pain.

## 2015-02-18 ENCOUNTER — Encounter (HOSPITAL_COMMUNITY): Payer: Self-pay | Admitting: Emergency Medicine

## 2015-02-18 ENCOUNTER — Emergency Department (HOSPITAL_COMMUNITY)
Admission: EM | Admit: 2015-02-18 | Discharge: 2015-02-18 | Disposition: A | Discharge status: Home / Self Care | Payer: Self-pay | Attending: Emergency Medicine | Admitting: Emergency Medicine

## 2015-02-18 DIAGNOSIS — L0291 Cutaneous abscess, unspecified: Secondary | ICD-10-CM

## 2015-02-18 DIAGNOSIS — Z7952 Long term (current) use of systemic steroids: Secondary | ICD-10-CM | POA: Insufficient documentation

## 2015-02-18 DIAGNOSIS — Z880 Allergy status to penicillin: Secondary | ICD-10-CM | POA: Insufficient documentation

## 2015-02-18 DIAGNOSIS — Z8709 Personal history of other diseases of the respiratory system: Secondary | ICD-10-CM | POA: Insufficient documentation

## 2015-02-18 DIAGNOSIS — Z720 Tobacco use: Secondary | ICD-10-CM | POA: Insufficient documentation

## 2015-02-18 DIAGNOSIS — L03116 Cellulitis of left lower limb: Secondary | ICD-10-CM | POA: Insufficient documentation

## 2015-02-18 DIAGNOSIS — Z79899 Other long term (current) drug therapy: Secondary | ICD-10-CM | POA: Insufficient documentation

## 2015-02-18 MED ORDER — LIDOCAINE-EPINEPHRINE (PF) 2 %-1:200000 IJ SOLN
10.0000 mL | Freq: Once | INTRAMUSCULAR | Status: AC
  Administered 2015-02-18: 10 mL
  Filled 2015-02-18: qty 20

## 2015-02-18 MED ORDER — SULFAMETHOXAZOLE-TRIMETHOPRIM 800-160 MG PO TABS: 1.0000 | ORAL_TABLET | Freq: Two times a day (BID) | ORAL | Status: AC

## 2015-02-18 MED ORDER — BACITRACIN ZINC 500 UNIT/GM EX OINT
1.0000 | TOPICAL_OINTMENT | Freq: Two times a day (BID) | CUTANEOUS | Status: DC
Start: 2015-02-18 — End: 2015-02-18
  Administered 2015-02-18: 1 via TOPICAL

## 2015-02-18 NOTE — Discharge Instructions (Signed)
Please take your antibiotics as prescribed. It is important to keep your wound clean and dry. Follow-up with your doctor in 1 week for reevaluation. Return to ED sooner for worsening symptoms.  Abscess Care After An abscess (also called a boil or furuncle) is an infected area that contains a collection of pus. Signs and symptoms of an abscess include pain, tenderness, redness, or hardness, or you may feel a moveable soft area under your skin. An abscess can occur anywhere in the body. The infection may spread to surrounding tissues causing cellulitis. A cut (incision) by the surgeon was made over your abscess and the pus was drained out. Gauze may have been packed into the space to provide a drain that will allow the cavity to heal from the inside outwards. The boil may be painful for 5 to 7 days. Most people with a boil do not have high fevers. Your abscess, if seen early, may not have localized, and may not have been lanced. If not, another appointment may be required for this if it does not get better on its own or with medications. HOME CARE INSTRUCTIONS   Only take over-the-counter or prescription medicines for pain, discomfort, or fever as directed by your caregiver.  When you bathe, soak and then remove gauze or iodoform packs at least daily or as directed by your caregiver. You may then wash the wound gently with mild soapy water. Repack with gauze or do as your caregiver directs. SEEK IMMEDIATE MEDICAL CARE IF:   You develop increased pain, swelling, redness, drainage, or bleeding in the wound site.  You develop signs of generalized infection including muscle aches, chills, fever, or a general ill feeling.  An oral temperature above 102 F (38.9 C) develops, not controlled by medication. See your caregiver for a recheck if you develop any of the symptoms described above. If medications (antibiotics) were prescribed, take them as directed. Document Released: 11/19/2004 Document Revised:  07/26/2011 Document Reviewed: 07/17/2007 [facility] Patient Information 2015 [place], [place]. This information is not intended to replace advice given to you by your health care provider. Make sure you discuss any questions you have with your health care provider.  Cellulitis Cellulitis is an infection of the skin and the tissue under the skin. The infected area is usually red and tender. This happens most often in the arms and lower legs. HOME CARE   Take your antibiotic medicine as told. Finish the medicine even if you start to feel better.  Keep the infected arm or leg raised (elevated).  Put a warm cloth on the area up to 4 times per day.  Only take medicines as told by your doctor.  Keep all doctor visits as told. GET HELP IF:  You see red streaks on the skin coming from the infected area.  Your red area gets bigger or turns a dark color.  Your bone or joint under the infected area is painful after the skin heals.  Your infection comes back in the same area or different area.  You have a puffy (swollen) bump in the infected area.  You have new symptoms.  You have a fever. GET HELP RIGHT AWAY IF:   You feel very sleepy.  You throw up (vomit) or have watery poop (diarrhea).  You feel sick and have muscle aches and pains. MAKE SURE YOU:   Understand these instructions.  Will watch your condition.  Will get help right away if you are not doing well or get worse. Document Released:  10/20/2007 Document Revised: 09/17/2013 Document Reviewed: 07/19/2011 [employer] Patient Information 2015 [place], [place]. This information is not intended to replace advice given to you by your health care provider. Make sure you discuss any questions you have with your health care provider.
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
## 2015-02-18 NOTE — ED Notes (Signed)
Started 1 week ago with "white head" to left lower leg. Now swollen,red, open wound with exudate.  Pt has been seen here recently for eczema and is currently having an outbreak. Wants refill on previous meds.

## 2015-02-28 ENCOUNTER — Emergency Department (HOSPITAL_COMMUNITY)
Admission: EM | Admit: 2015-02-28 | Discharge: 2015-02-28 | Disposition: A | Discharge status: Home / Self Care | Payer: Self-pay | Attending: Emergency Medicine | Admitting: Emergency Medicine

## 2015-02-28 DIAGNOSIS — Z872 Personal history of diseases of the skin and subcutaneous tissue: Secondary | ICD-10-CM | POA: Insufficient documentation

## 2015-02-28 DIAGNOSIS — Z8709 Personal history of other diseases of the respiratory system: Secondary | ICD-10-CM | POA: Insufficient documentation

## 2015-02-28 DIAGNOSIS — Z7952 Long term (current) use of systemic steroids: Secondary | ICD-10-CM | POA: Insufficient documentation

## 2015-02-28 DIAGNOSIS — Z720 Tobacco use: Secondary | ICD-10-CM | POA: Insufficient documentation

## 2015-02-28 DIAGNOSIS — Z880 Allergy status to penicillin: Secondary | ICD-10-CM | POA: Insufficient documentation

## 2015-02-28 DIAGNOSIS — Z5189 Encounter for other specified aftercare: Secondary | ICD-10-CM

## 2015-02-28 DIAGNOSIS — Z79899 Other long term (current) drug therapy: Secondary | ICD-10-CM | POA: Insufficient documentation

## 2015-02-28 DIAGNOSIS — Z4801 Encounter for change or removal of surgical wound dressing: Secondary | ICD-10-CM | POA: Insufficient documentation

## 2015-02-28 NOTE — Discharge Instructions (Signed)
Wound Care °Taking care of your wound properly can help to prevent pain and infection. It can also help your wound to heal more quickly.  °HOW TO CARE FOR YOUR WOUND  °· Take or apply over-the-counter and prescription medicines only as told by your health care provider. °· If you were prescribed antibiotic medicine, take or apply it as told by your health care provider. Do not stop using the antibiotic even if your condition improves. °· Clean the wound each day or as told by your health care provider. °¨ Wash the wound with mild soap and water. °¨ Rinse the wound with water to remove all soap. °¨ Pat the wound dry with a clean towel. Do not rub it. °· There are many different ways to close and cover a wound. For example, a wound can be covered with stitches (sutures), skin glue, or adhesive strips. Follow instructions from your health care provider about: °¨ How to take care of your wound. °¨ When and how you should change your bandage (dressing). °¨ When you should remove your dressing. °¨ Removing whatever was used to close your wound. °· Check your wound every day for signs of infection. Watch for: °¨ Redness, swelling, or pain. °¨ Fluid, blood, or pus. °· Keep the dressing dry until your health care provider says it can be removed. Do not take baths, swim, use a hot tub, or do anything that would put your wound underwater until your health care provider approves. °· Raise (elevate) the injured area above the level of your heart while you are sitting or lying down. °· Do not scratch or pick at the wound. °· Keep all follow-up visits as told by your health care provider. This is important. °SEEK MEDICAL CARE IF: °· You received a tetanus shot and you have swelling, severe pain, redness, or bleeding at the injection site. °· You have a fever. °· Your pain is not controlled with medicine. °· You have increased redness, swelling, or pain at the site of your wound. °· You have fluid, blood, or pus coming from your  wound. °· You notice a bad smell coming from your wound or your dressing. °SEEK IMMEDIATE MEDICAL CARE IF: °· You have a red streak going away from your wound. °  °This information is not intended to replace advice given to you by your health care provider. Make sure you discuss any questions you have with your health care provider. °  °Document Released: 02/10/2008 Document Revised: 09/17/2014 Document Reviewed: 04/29/2014 °Elsevier Interactive Patient Education ©2016 Elsevier Inc. ° °

## 2015-02-28 NOTE — ED Notes (Signed)
Patient here for follow-up of left lower extremity abscess I+D. Denies worsening complaints, but states was advised to return for follow-up. Has been taking ABX as prescribed.
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
## 2015-04-18 ENCOUNTER — Emergency Department (HOSPITAL_COMMUNITY)
Admission: EM | Admit: 2015-04-18 | Discharge: 2015-04-18 | Disposition: A | Discharge status: Home / Self Care | Payer: Self-pay | Attending: Emergency Medicine | Admitting: Emergency Medicine

## 2015-04-18 ENCOUNTER — Encounter (HOSPITAL_COMMUNITY): Payer: Self-pay | Admitting: Emergency Medicine

## 2015-04-18 DIAGNOSIS — F17210 Nicotine dependence, cigarettes, uncomplicated: Secondary | ICD-10-CM | POA: Insufficient documentation

## 2015-04-18 DIAGNOSIS — L739 Follicular disorder, unspecified: Secondary | ICD-10-CM | POA: Insufficient documentation

## 2015-04-18 DIAGNOSIS — L02416 Cutaneous abscess of left lower limb: Secondary | ICD-10-CM | POA: Insufficient documentation

## 2015-04-18 DIAGNOSIS — L02411 Cutaneous abscess of right axilla: Secondary | ICD-10-CM | POA: Insufficient documentation

## 2015-04-18 DIAGNOSIS — L02415 Cutaneous abscess of right lower limb: Secondary | ICD-10-CM | POA: Insufficient documentation

## 2015-04-18 DIAGNOSIS — Z3202 Encounter for pregnancy test, result negative: Secondary | ICD-10-CM | POA: Insufficient documentation

## 2015-04-18 DIAGNOSIS — Z880 Allergy status to penicillin: Secondary | ICD-10-CM | POA: Insufficient documentation

## 2015-04-18 DIAGNOSIS — L0291 Cutaneous abscess, unspecified: Secondary | ICD-10-CM

## 2015-04-18 DIAGNOSIS — L02412 Cutaneous abscess of left axilla: Secondary | ICD-10-CM | POA: Insufficient documentation

## 2015-04-18 LAB — CBC WITH DIFFERENTIAL/PLATELET
BASOS PCT: 0 %
Basophils Absolute: 0 10*3/uL (ref 0.0–0.1)
EOS ABS: 0.3 10*3/uL (ref 0.0–0.7)
EOS PCT: 4 %
HCT: 29.3 % — ABNORMAL LOW (ref 36.0–46.0)
HEMOGLOBIN: 9.9 g/dL — AB (ref 12.0–15.0)
LYMPHS ABS: 1.1 10*3/uL (ref 0.7–4.0)
Lymphocytes Relative: 13 %
MCH: 25.2 pg — AB (ref 26.0–34.0)
MCHC: 33.8 g/dL (ref 30.0–36.0)
MCV: 74.6 fL — ABNORMAL LOW (ref 78.0–100.0)
Monocytes Absolute: 0.9 10*3/uL (ref 0.1–1.0)
Monocytes Relative: 10 %
NEUTROS PCT: 73 %
Neutro Abs: 6 10*3/uL (ref 1.7–7.7)
PLATELETS: 363 10*3/uL (ref 150–400)
RBC: 3.93 MIL/uL (ref 3.87–5.11)
RDW: 16.4 % — ABNORMAL HIGH (ref 11.5–15.5)
WBC: 8.3 10*3/uL (ref 4.0–10.5)

## 2015-04-18 LAB — COMPREHENSIVE METABOLIC PANEL
ALK PHOS: 70 U/L (ref 38–126)
ALT: 12 U/L — AB (ref 14–54)
AST: 21 U/L (ref 15–41)
Albumin: 3.9 g/dL (ref 3.5–5.0)
Anion gap: 8 (ref 5–15)
CALCIUM: 8.9 mg/dL (ref 8.9–10.3)
CHLORIDE: 106 mmol/L (ref 101–111)
CO2: 21 mmol/L — ABNORMAL LOW (ref 22–32)
CREATININE: 0.75 mg/dL (ref 0.44–1.00)
GFR calc Af Amer: >60 mL/min (ref >60)
Glucose, Bld: 99 mg/dL (ref 65–99)
Potassium: 3.6 mmol/L (ref 3.5–5.1)
Sodium: 135 mmol/L (ref 135–145)
Total Bilirubin: 0.3 mg/dL (ref 0.3–1.2)
Total Protein: 7.4 g/dL (ref 6.5–8.1)

## 2015-04-18 LAB — HCG, SERUM, QUALITATIVE: PREG SERUM: NEGATIVE

## 2015-04-18 MED ORDER — OXYCODONE-ACETAMINOPHEN 5-325 MG PO TABS
1.0000 | ORAL_TABLET | Freq: Once | ORAL | Status: AC
  Administered 2015-04-18: 1 via ORAL
  Filled 2015-04-18: qty 1

## 2015-04-18 MED ORDER — SULFAMETHOXAZOLE-TRIMETHOPRIM 800-160 MG PO TABS: 1.0000 | ORAL_TABLET | Freq: Two times a day (BID) | ORAL | Status: AC

## 2015-04-18 MED ORDER — TRIAMCINOLONE ACETONIDE 0.025 % EX OINT: 1.0000 "application " | TOPICAL_OINTMENT | Freq: Two times a day (BID) | CUTANEOUS | Status: DC

## 2015-04-18 MED ORDER — LIDOCAINE-EPINEPHRINE 1 %-1:100000 IJ SOLN
30.0000 mL | Freq: Once | INTRAMUSCULAR | Status: AC
Start: 2015-04-18 — End: 2015-04-18
  Administered 2015-04-18: 30 mL via INTRADERMAL
  Filled 2015-04-18: qty 1

## 2015-04-18 NOTE — ED Notes (Signed)
PT HAS HEAD COVERED UP.  NOT ANSWERING QUESTIONS ASKED

## 2015-04-18 NOTE — Discharge Instructions (Signed)
Folliculitis °Folliculitis is redness, soreness, and swelling (inflammation) of the hair follicles. This condition can occur anywhere on the body. People with weakened immune systems, diabetes, or obesity have a greater risk of getting folliculitis. °CAUSES °· Bacterial infection. This is the most common cause. °· Fungal infection. °· Viral infection. °· Contact with certain chemicals, especially oils and tars. °Long-term folliculitis can result from bacteria that live in the nostrils. The bacteria may trigger multiple outbreaks of folliculitis over time. °SYMPTOMS °Folliculitis most commonly occurs on the scalp, thighs, legs, back, buttocks, and areas where hair is shaved frequently. An early sign of folliculitis is a small, white or yellow, pus-filled, itchy lesion (pustule). These lesions appear on a red, inflamed follicle. They are usually less than 0.2 inches (5 mm) wide. When there is an infection of the follicle that goes deeper, it becomes a boil or furuncle. A group of closely packed boils creates a larger lesion (carbuncle). Carbuncles tend to occur in hairy, sweaty areas of the body. °DIAGNOSIS  °Your caregiver can usually tell what is wrong by doing a physical exam. A sample may be taken from one of the lesions and tested in a lab. This can help determine what is causing your folliculitis. °TREATMENT  °Treatment may include: °· Applying warm compresses to the affected areas. °· Taking antibiotic medicines orally or applying them to the skin. °· Draining the lesions if they contain a large amount of pus or fluid. °· Laser hair removal for cases of long-lasting folliculitis. This helps to prevent regrowth of the hair. °HOME CARE INSTRUCTIONS °· Apply warm compresses to the affected areas as directed by your caregiver. °· If antibiotics are prescribed, take them as directed. Finish them even if you start to feel better. °· You may take over-the-counter medicines to relieve itching. °· Do not shave irritated  skin. °· Follow up with your caregiver as directed. °SEEK IMMEDIATE MEDICAL CARE IF:  °· You have increasing redness, swelling, or pain in the affected area. °· You have a fever. °MAKE SURE YOU: °· Understand these instructions. °· Will watch your condition. °· Will get help right away if you are not doing well or get worse. °  °This information is not intended to replace advice given to you by your health care provider. Make sure you discuss any questions you have with your health care provider. °  °Document Released: 07/12/2001 Document Revised: 05/24/2014 Document Reviewed: 08/03/2011 °Elsevier Interactive Patient Education ©2016 Elsevier Inc. ° °

## 2015-04-18 NOTE — Care Management (Addendum)
ED CM in room to speak with patient. Patient has head covered with blanket refusing to answer questions.  CM made patient aware information sheet for primary care was left  on bedside table.
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
## 2015-04-18 NOTE — ED Notes (Signed)
PT C/O A RASH AND INFECTION ON HER LOWER LEGS SINCE SHE WAS 39 YEARS OLD.  SHE HAS BEEN TO THE ED NUYMERUS TIMES FOR THE SAME ACCORDING TO HER.Marland Kitchen.  DARK COLORED PATCHES TO HER LOWER LEGS WITH AREAS OF INFECTED PIMPLES  ALSO

## 2015-04-18 NOTE — ED Notes (Signed)
Case manager in to see.  The pt would not talk to her   She kept her head covered with blanket

## 2015-04-18 NOTE — ED Notes (Signed)
Pt from home with c/o worsening skin condition on bilateral LE, bilateral axilla, neck, back, buttocks.  Bilateral leg swelling noted.  Multiple abscesses of various sized with white heads, surrounded by redness noted to LE.  Pt reports being unable to get into a dermatologist quickly.  Pt in NAD, A&O.

## 2015-04-18 NOTE — ED Notes (Signed)
Pt left with all her belongings and ambulated out of treatment area.  

## 2015-04-18 NOTE — ED Provider Notes (Signed)
Complains of painful abscesses on bilateral axilla, and bilateral legs since October 2016. Denies fever denies IV drug use. No treatment prior to coming here. She has had similar abscesses drained in the past. Has history of eczema. On exam alert nontoxic skin with hyperpigmented dry scaly patchy areas on trunk and extremities consistent with eczema. She has multiple tiny pustules on her lower extremities consistent with folliculitis or small abscesses. Also with hidradenitis or purpura bilaterally. She does not appear systemically ill.  Doug SouSam Kynzi Levay, MD 04/19/15 [phone]

## 2015-05-02 ENCOUNTER — Inpatient Hospital Stay: Payer: Self-pay

## 2015-05-02 ENCOUNTER — Encounter: Payer: Self-pay | Admitting: Physician Assistant

## 2015-05-02 ENCOUNTER — Ambulatory Visit: Payer: Self-pay | Attending: Family Medicine | Admitting: Physician Assistant

## 2015-05-02 ENCOUNTER — Telehealth: Payer: Self-pay

## 2015-05-02 VITALS — BP 105/71 | HR 85 | Temp 98.4°F | Resp 16 | Ht 66.0 in | Wt 141.0 lb

## 2015-05-02 DIAGNOSIS — L309 Dermatitis, unspecified: Secondary | ICD-10-CM | POA: Insufficient documentation

## 2015-05-02 DIAGNOSIS — Z720 Tobacco use: Secondary | ICD-10-CM

## 2015-05-02 DIAGNOSIS — D649 Anemia, unspecified: Secondary | ICD-10-CM

## 2015-05-02 DIAGNOSIS — F17200 Nicotine dependence, unspecified, uncomplicated: Secondary | ICD-10-CM

## 2015-05-02 MED ORDER — HYDROXYZINE HCL 25 MG PO TABS: 25.0000 mg | ORAL_TABLET | Freq: Four times a day (QID) | ORAL | Status: DC

## 2015-05-02 MED ORDER — TRIAMCINOLONE ACETONIDE 0.025 % EX OINT: 1.0000 "application " | TOPICAL_OINTMENT | Freq: Two times a day (BID) | CUTANEOUS | Status: DC

## 2015-05-02 NOTE — Progress Notes (Signed)
Patients ED f/up for folliculitis. Patient said her left leg is worse than her right leg.   Patients denies pain at this time.  Patient looking to est. are with PCP. Patient requesting rx of Kenalog.
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
## 2015-05-02 NOTE — Telephone Encounter (Signed)
CMA called patient, patient verified name and DOB. Patient left without her prescription, so [facility]. Patient was informed that Rx has been faxed over.

## 2015-05-03 LAB — IRON,TIBC AND FERRITIN PANEL
%SAT: 3 % — AB (ref 11–50)
FERRITIN: 9 ng/mL — AB (ref 10–291)
IRON: 13 ug/dL — AB (ref 40–190)
TIBC: 493 ug/dL — ABNORMAL HIGH (ref 250–450)

## 2015-05-05 ENCOUNTER — Other Ambulatory Visit: Payer: Self-pay | Admitting: Physician Assistant

## 2015-05-05 MED ORDER — FERROUS SULFATE 325 (65 FE) MG PO TBEC: 325.0000 mg | DELAYED_RELEASE_TABLET | Freq: Two times a day (BID) | ORAL | Status: DC

## 2015-05-06 ENCOUNTER — Telehealth: Payer: Self-pay

## 2015-05-06 NOTE — Telephone Encounter (Signed)
See previous note

## 2015-05-06 NOTE — Telephone Encounter (Signed)
Nurse called patient, patient verified date of birth. Patient aware of low iron levels. Patient agrees to pick up iron at pharmacy, prescription has been sent to pharmacy.  Patient agrees to take iron 325mg  twice daily with meals.  Patient voices understanding and has no further questions at this time.

## 2015-05-06 NOTE — Telephone Encounter (Signed)
-----   Message from Vivianne Masteriffany S Noel, [place] sent at 05/05/2015 11:29 AM EST ----- Please let this patient know that her iron levels are low and that she needs to be on replacement. She needs to be on Iron 325 mg twice daily with meals. Script has been sent in. Thanks. ----- Message -----    From: Lab in [facility] Interface    Sent: 05/03/2015   4:44 PM      To: Vivianne Masteriffany S Noel, PA-C

## 2015-05-13 ENCOUNTER — Other Ambulatory Visit: Payer: Self-pay | Admitting: *Deleted

## 2015-05-13 MED ORDER — TRIAMCINOLONE ACETONIDE 0.025 % EX OINT: 1.0000 "application " | TOPICAL_OINTMENT | Freq: Two times a day (BID) | CUTANEOUS | Status: DC

## 2015-05-13 NOTE — Telephone Encounter (Signed)
Patient walked in to clinic stating Barbara Juniffany Noel, PA prescribed her kenalog cream at her last visit and she never got the prescription.  According to Epic the Rx was printed.  This RN will refill medication to [facility] pharmacy

## 2015-05-23 ENCOUNTER — Encounter (HOSPITAL_COMMUNITY): Payer: Self-pay | Admitting: Emergency Medicine

## 2015-05-23 ENCOUNTER — Emergency Department (HOSPITAL_COMMUNITY)
Admission: EM | Admit: 2015-05-23 | Discharge: 2015-05-23 | Disposition: A | Discharge status: Home / Self Care | Payer: Self-pay | Attending: Physician Assistant | Admitting: Physician Assistant

## 2015-05-23 DIAGNOSIS — Z8709 Personal history of other diseases of the respiratory system: Secondary | ICD-10-CM | POA: Insufficient documentation

## 2015-05-23 DIAGNOSIS — F17210 Nicotine dependence, cigarettes, uncomplicated: Secondary | ICD-10-CM | POA: Insufficient documentation

## 2015-05-23 DIAGNOSIS — Z79899 Other long term (current) drug therapy: Secondary | ICD-10-CM | POA: Insufficient documentation

## 2015-05-23 DIAGNOSIS — Z7952 Long term (current) use of systemic steroids: Secondary | ICD-10-CM | POA: Insufficient documentation

## 2015-05-23 DIAGNOSIS — W57XXXA Bitten or stung by nonvenomous insect and other nonvenomous arthropods, initial encounter: Secondary | ICD-10-CM

## 2015-05-23 DIAGNOSIS — Z880 Allergy status to penicillin: Secondary | ICD-10-CM | POA: Insufficient documentation

## 2015-05-23 DIAGNOSIS — L089 Local infection of the skin and subcutaneous tissue, unspecified: Secondary | ICD-10-CM | POA: Insufficient documentation

## 2015-05-23 MED ORDER — DOXYCYCLINE HYCLATE 100 MG PO CAPS: 100.0000 mg | ORAL_CAPSULE | Freq: Two times a day (BID) | ORAL | Status: DC

## 2015-05-23 NOTE — ED Notes (Signed)
Pt here for multiple abscesses to arms and legs. Has had this problem since early December. Has been seen at [facility] since then.
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
## 2015-05-23 NOTE — Discharge Instructions (Signed)

## 2015-05-23 NOTE — ED Notes (Signed)
Suture Cart at bedside.  

## 2015-06-05 ENCOUNTER — Ambulatory Visit: Payer: Self-pay | Admitting: Family Medicine

## 2015-06-27 ENCOUNTER — Ambulatory Visit: Payer: Self-pay | Admitting: Family Medicine

## 2016-02-22 ENCOUNTER — Encounter (HOSPITAL_COMMUNITY): Payer: Self-pay | Admitting: Emergency Medicine

## 2016-02-22 ENCOUNTER — Emergency Department (HOSPITAL_COMMUNITY)
Admission: EM | Admit: 2016-02-22 | Discharge: 2016-02-23 | Disposition: A | Discharge status: Home / Self Care | Payer: Self-pay | Attending: Emergency Medicine | Admitting: Emergency Medicine

## 2016-02-22 DIAGNOSIS — M533 Sacrococcygeal disorders, not elsewhere classified: Secondary | ICD-10-CM | POA: Insufficient documentation

## 2016-02-22 DIAGNOSIS — F17210 Nicotine dependence, cigarettes, uncomplicated: Secondary | ICD-10-CM | POA: Insufficient documentation

## 2016-02-22 MED ORDER — HYDROCODONE-ACETAMINOPHEN 5-325 MG PO TABS
1.0000 | ORAL_TABLET | Freq: Once | ORAL | Status: AC
  Administered 2016-02-22: 1 via ORAL
  Filled 2016-02-22: qty 1
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
## 2016-02-22 NOTE — ED Triage Notes (Signed)
C/o lower back pain since lifting a heavy pallet at work today.

## 2016-02-23 ENCOUNTER — Emergency Department (HOSPITAL_COMMUNITY): Payer: Self-pay

## 2016-02-23 MED ORDER — NAPROXEN 375 MG PO TABS: 375.0000 mg | ORAL_TABLET | Freq: Two times a day (BID) | ORAL | 0 refills | Status: DC

## 2016-02-23 MED ORDER — TRAMADOL HCL 50 MG PO TABS: 50.0000 mg | ORAL_TABLET | Freq: Four times a day (QID) | ORAL | 0 refills | Status: DC | PRN

## 2016-02-23 MED ORDER — CYCLOBENZAPRINE HCL 10 MG PO TABS: 10.0000 mg | ORAL_TABLET | Freq: Two times a day (BID) | ORAL | 0 refills | Status: DC | PRN

## 2016-02-23 NOTE — Discharge Instructions (Signed)
Your x-ray tonight shows no fracture or abnormal findings of the area where your are having pain. We are treating your pain and having you follow up with an orthopedic doctor. Do not drive while taking the narcotic or the muscle relaxant because they will make you sleepy. Follow up with Dr. Lajoyce Cornersuda for further evaluation.

## 2016-05-15 ENCOUNTER — Other Ambulatory Visit: Payer: Self-pay | Admitting: Physician Assistant

## 2017-02-15 ENCOUNTER — Emergency Department (HOSPITAL_COMMUNITY)
Admission: EM | Admit: 2017-02-15 | Discharge: 2017-02-15 | Disposition: A | Discharge status: Home / Self Care | Payer: Self-pay | Attending: Emergency Medicine | Admitting: Emergency Medicine

## 2017-02-15 ENCOUNTER — Encounter (HOSPITAL_COMMUNITY): Payer: Self-pay

## 2017-02-15 DIAGNOSIS — L2082 Flexural eczema: Secondary | ICD-10-CM | POA: Insufficient documentation

## 2017-02-15 DIAGNOSIS — F17210 Nicotine dependence, cigarettes, uncomplicated: Secondary | ICD-10-CM | POA: Insufficient documentation

## 2017-02-15 DIAGNOSIS — L2089 Other atopic dermatitis: Secondary | ICD-10-CM

## 2017-02-15 DIAGNOSIS — Z79899 Other long term (current) drug therapy: Secondary | ICD-10-CM | POA: Insufficient documentation

## 2017-02-15 MED ORDER — DEXAMETHASONE SODIUM PHOSPHATE 10 MG/ML IJ SOLN
6.0000 mg | Freq: Once | INTRAMUSCULAR | Status: AC
  Administered 2017-02-15: 6 mg via INTRAMUSCULAR
  Filled 2017-02-15: qty 1

## 2017-02-15 MED ORDER — TRIAMCINOLONE ACETONIDE 0.1 % EX CREA: 1.0000 "application " | TOPICAL_CREAM | Freq: Two times a day (BID) | CUTANEOUS | 0 refills | Status: DC

## 2017-02-15 MED ORDER — TRIAMCINOLONE ACETONIDE 0.1 % EX OINT
1.0000 | TOPICAL_OINTMENT | Freq: Two times a day (BID) | CUTANEOUS | 0 refills | Status: DC
Start: 2017-02-15 — End: 2017-06-20

## 2017-02-15 NOTE — ED Triage Notes (Signed)
Pt presents for evaluation of eczema, states is out prescriptions and feels like she needs steroids. Itching to arms, neck, and bilateral legs.
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
## 2017-02-15 NOTE — ED Notes (Signed)
No answer when called to come to room. Pt not seen in waiting room. RN called 4x

## 2017-02-15 NOTE — Discharge Instructions (Signed)
Please read instructions below. Apply a thin layer of triamcinolone to your rash 2 times per day for two weeks, then switch to over the counter hydrocortisone cream if rash persists. Apply vaseline over the steroid cream. You can take benadryl every 6 hours as needed for additional itch relief. Schedule an appointment with the dermatologist to follow up on your recurrent eczema flares. Return to the ER immediately for pus draining from your rash, fever, or new or concerning symptoms.

## 2017-02-26 ENCOUNTER — Encounter (HOSPITAL_COMMUNITY): Payer: Self-pay | Admitting: *Deleted

## 2017-02-26 ENCOUNTER — Emergency Department (HOSPITAL_COMMUNITY)
Admission: EM | Admit: 2017-02-26 | Discharge: 2017-02-26 | Disposition: A | Discharge status: Home / Self Care | Payer: Self-pay | Attending: Emergency Medicine | Admitting: Emergency Medicine

## 2017-02-26 DIAGNOSIS — Z79899 Other long term (current) drug therapy: Secondary | ICD-10-CM | POA: Insufficient documentation

## 2017-02-26 DIAGNOSIS — F17210 Nicotine dependence, cigarettes, uncomplicated: Secondary | ICD-10-CM | POA: Insufficient documentation

## 2017-02-26 DIAGNOSIS — L209 Atopic dermatitis, unspecified: Secondary | ICD-10-CM | POA: Insufficient documentation

## 2017-02-26 MED ORDER — TRIAMCINOLONE ACETONIDE 0.025 % EX OINT: 1.0000 "application " | TOPICAL_OINTMENT | Freq: Two times a day (BID) | CUTANEOUS | 0 refills | Status: DC

## 2017-02-26 MED ORDER — TRIAMCINOLONE ACETONIDE 0.1 % EX CREA: 1.0000 "application " | TOPICAL_CREAM | Freq: Two times a day (BID) | CUTANEOUS | 0 refills | Status: DC

## 2017-02-26 MED ORDER — DEXAMETHASONE SODIUM PHOSPHATE 10 MG/ML IJ SOLN
10.0000 mg | Freq: Once | INTRAMUSCULAR | Status: AC
  Administered 2017-02-26: 10 mg via INTRAMUSCULAR
  Filled 2017-02-26: qty 1

## 2017-02-26 NOTE — ED Notes (Signed)
ED Provider at bedside. 
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
## 2017-02-26 NOTE — ED Triage Notes (Signed)
Pt reports being seen on 10/2 for eczema and prescribed incorrect cream, needs a different cream due to severe itching and rash and all over.

## 2017-02-26 NOTE — Discharge Instructions (Signed)
I have written a prescription for Triamcinolone Cream 0.1 ( ). This is a larger quantity, the one which you have gotten in the past. Please apply this to the skin twice a day. Please also continue adding a lubricating lotion or cream in order to maintain skin hydration.  I did review the information to follow up with a dermatologist (Dr. Samuel Rittenhouse Filbert.) Please schedule an appointment to follow up with her concerning chronic management of eczema.  Please return to the emergency department if he developed fever with rash or you notice any pus coming from areas in the skin.

## 2017-02-26 NOTE — ED Notes (Signed)
Declined W/C at D/C and was escorted to lobby by RN. 

## 2017-06-20 ENCOUNTER — Encounter (HOSPITAL_COMMUNITY): Payer: Self-pay | Admitting: Emergency Medicine

## 2017-06-20 ENCOUNTER — Ambulatory Visit (HOSPITAL_COMMUNITY)
Admission: EM | Admit: 2017-06-20 | Discharge: 2017-06-20 | Disposition: A | Discharge status: Home / Self Care | Payer: Self-pay | Attending: Family Medicine | Admitting: Family Medicine

## 2017-06-20 ENCOUNTER — Other Ambulatory Visit: Payer: Self-pay

## 2017-06-20 DIAGNOSIS — L209 Atopic dermatitis, unspecified: Secondary | ICD-10-CM

## 2017-06-20 MED ORDER — TRIAMCINOLONE ACETONIDE 0.025 % EX OINT: 1.0000 "application " | TOPICAL_OINTMENT | Freq: Two times a day (BID) | CUTANEOUS | 0 refills | Status: DC

## 2017-06-20 MED ORDER — PREDNISONE 20 MG PO TABS: 40.0000 mg | ORAL_TABLET | Freq: Every day | ORAL | 0 refills | Status: AC

## 2017-06-20 NOTE — Discharge Instructions (Signed)
Start prednisone as directed. I have refilled your triamcinolone ointment 0.025%, please use as directed. Monitor for any spreading redness, increased warmth, fever, follow up for reevaluation. Otherwise, follow up with PCP/dermatologist for further evaluation needed.
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
## 2017-06-20 NOTE — ED Triage Notes (Signed)
[facility] a [facility] possible [facility]ema flair up x 6 days.

## 2017-08-21 ENCOUNTER — Ambulatory Visit (HOSPITAL_COMMUNITY)
Admission: EM | Admit: 2017-08-21 | Discharge: 2017-08-21 | Disposition: A | Discharge status: Home / Self Care | Payer: Self-pay | Attending: Internal Medicine | Admitting: Internal Medicine

## 2017-08-21 ENCOUNTER — Encounter (HOSPITAL_COMMUNITY): Payer: Self-pay | Admitting: Emergency Medicine

## 2017-08-21 DIAGNOSIS — L209 Atopic dermatitis, unspecified: Secondary | ICD-10-CM

## 2017-08-21 MED ORDER — TRIAMCINOLONE ACETONIDE 0.025 % EX OINT: 1.0000 "application " | TOPICAL_OINTMENT | Freq: Two times a day (BID) | CUTANEOUS | 0 refills | Status: DC

## 2017-08-21 MED ORDER — PREDNISONE 20 MG PO TABS: 40.0000 mg | ORAL_TABLET | Freq: Every day | ORAL | 0 refills | Status: AC

## 2017-08-21 NOTE — ED Triage Notes (Signed)
Pt sts increased eczema sx with hx of same
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
## 2017-08-21 NOTE — Discharge Instructions (Signed)
Please establish with a primary care provider and/or with dermatology for long term management of your skin.

## 2017-09-29 ENCOUNTER — Ambulatory Visit (INDEPENDENT_AMBULATORY_CARE_PROVIDER_SITE_OTHER): Payer: Self-pay | Admitting: Nurse Practitioner

## 2017-10-12 ENCOUNTER — Ambulatory Visit (HOSPITAL_COMMUNITY)
Admission: EM | Admit: 2017-10-12 | Discharge: 2017-10-12 | Disposition: A | Discharge status: Home / Self Care | Payer: Self-pay | Attending: Family Medicine | Admitting: Family Medicine

## 2017-10-12 ENCOUNTER — Encounter (HOSPITAL_COMMUNITY): Payer: Self-pay | Admitting: Emergency Medicine

## 2017-10-12 DIAGNOSIS — S21209A Unspecified open wound of unspecified back wall of thorax without penetration into thoracic cavity, initial encounter: Secondary | ICD-10-CM

## 2017-10-12 DIAGNOSIS — L0291 Cutaneous abscess, unspecified: Secondary | ICD-10-CM

## 2017-10-12 MED ORDER — DOXYCYCLINE HYCLATE 100 MG PO CAPS: 100.0000 mg | ORAL_CAPSULE | Freq: Two times a day (BID) | ORAL | 0 refills | Status: DC

## 2017-10-12 MED ORDER — TRIAMCINOLONE ACETONIDE 0.025 % EX OINT: 1.0000 "application " | TOPICAL_OINTMENT | Freq: Two times a day (BID) | CUTANEOUS | 0 refills | Status: DC

## 2017-10-12 MED ORDER — MUPIROCIN 2 % EX OINT: 1.0000 "application " | TOPICAL_OINTMENT | Freq: Two times a day (BID) | CUTANEOUS | 0 refills | Status: DC

## 2017-10-12 NOTE — ED Triage Notes (Signed)
Pt here with abscess to left side of face and back; pt also sts needs cream for eczema
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
## 2017-10-12 NOTE — Discharge Instructions (Signed)
Keep wound clean and dry. You can clean with soap and water. Daily dressings. Bactroban and doxycycline as directed. Warm compress as directed. It may take a few days for the swelling to slowly decrease. Monitor for spreading redness, increased warmth, fever, follow up for reevaluation needed.  Triamcinolone ointment refilled.

## 2017-12-30 ENCOUNTER — Ambulatory Visit (HOSPITAL_COMMUNITY)
Admission: EM | Admit: 2017-12-30 | Discharge: 2017-12-30 | Disposition: A | Discharge status: Home / Self Care | Payer: Self-pay | Attending: Family Medicine | Admitting: Family Medicine

## 2017-12-30 ENCOUNTER — Encounter (HOSPITAL_COMMUNITY): Payer: Self-pay | Admitting: Emergency Medicine

## 2017-12-30 DIAGNOSIS — L2084 Intrinsic (allergic) eczema: Secondary | ICD-10-CM

## 2017-12-30 DIAGNOSIS — L0292 Furuncle, unspecified: Secondary | ICD-10-CM

## 2017-12-30 MED ORDER — DOXYCYCLINE HYCLATE 100 MG PO CAPS: 100.0000 mg | ORAL_CAPSULE | Freq: Two times a day (BID) | ORAL | 3 refills | Status: DC

## 2017-12-30 MED ORDER — PREDNISONE 20 MG PO TABS: ORAL_TABLET | ORAL | 0 refills | Status: DC

## 2017-12-30 MED ORDER — TRIAMCINOLONE ACETONIDE 0.025 % EX OINT: 1.0000 "application " | TOPICAL_OINTMENT | Freq: Two times a day (BID) | CUTANEOUS | 3 refills | Status: DC
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
## 2017-12-30 NOTE — Discharge Instructions (Addendum)
Please return when you are insurance is in full force and we will get you a dermatology referral

## 2017-12-30 NOTE — ED Triage Notes (Signed)
Pt c/o small rashes or bumps on her face, and chest since Sunday. Pt states they are small abscesses.

## 2018-11-23 ENCOUNTER — Emergency Department (HOSPITAL_COMMUNITY)
Admission: EM | Admit: 2018-11-23 | Discharge: 2018-11-23 | Disposition: A | Discharge status: Home / Self Care | Payer: BC Managed Care – PPO | Attending: Emergency Medicine | Admitting: Emergency Medicine

## 2018-11-23 ENCOUNTER — Other Ambulatory Visit: Payer: Self-pay

## 2018-11-23 ENCOUNTER — Encounter (HOSPITAL_COMMUNITY): Payer: Self-pay | Admitting: Emergency Medicine

## 2018-11-23 ENCOUNTER — Emergency Department (HOSPITAL_COMMUNITY): Payer: BC Managed Care – PPO

## 2018-11-23 DIAGNOSIS — R222 Localized swelling, mass and lump, trunk: Secondary | ICD-10-CM | POA: Diagnosis not present

## 2018-11-23 DIAGNOSIS — F17210 Nicotine dependence, cigarettes, uncomplicated: Secondary | ICD-10-CM | POA: Diagnosis not present

## 2018-11-23 LAB — CBC WITH DIFFERENTIAL/PLATELET
Abs Immature Granulocytes: 0.03 10*3/uL (ref 0.00–0.07)
Basophils Absolute: 0 10*3/uL (ref 0.0–0.1)
Basophils Relative: 1 %
Eosinophils Absolute: 0.4 10*3/uL (ref 0.0–0.5)
Eosinophils Relative: 4 %
HCT: 33.5 % — ABNORMAL LOW (ref 36.0–46.0)
Hemoglobin: 10.8 g/dL — ABNORMAL LOW (ref 12.0–15.0)
Immature Granulocytes: 0 %
Lymphocytes Relative: 18 %
Lymphs Abs: 1.6 10*3/uL (ref 0.7–4.0)
MCH: 26.1 pg (ref 26.0–34.0)
MCHC: 32.2 g/dL (ref 30.0–36.0)
MCV: 80.9 fL (ref 80.0–100.0)
Monocytes Absolute: 1.1 10*3/uL — ABNORMAL HIGH (ref 0.1–1.0)
Monocytes Relative: 12 %
Neutro Abs: 5.7 10*3/uL (ref 1.7–7.7)
Neutrophils Relative %: 65 %
Platelets: 346 10*3/uL (ref 150–400)
RBC: 4.14 MIL/uL (ref 3.87–5.11)
RDW: 15.9 % — ABNORMAL HIGH (ref 11.5–15.5)
WBC: 8.9 10*3/uL (ref 4.0–10.5)
nRBC: 0 % (ref 0.0–0.2)

## 2018-11-23 LAB — BASIC METABOLIC PANEL
Anion gap: 8 (ref 5–15)
BUN: 10 mg/dL (ref 6–20)
CO2: 20 mmol/L — ABNORMAL LOW (ref 22–32)
Calcium: 9.4 mg/dL (ref 8.9–10.3)
Chloride: 107 mmol/L (ref 98–111)
Creatinine, Ser: 0.79 mg/dL (ref 0.44–1.00)
GFR calc Af Amer: >60 mL/min (ref >60)
GFR calc non Af Amer: >60 mL/min (ref >60)
Glucose, Bld: 89 mg/dL (ref 70–99)
Potassium: 4.3 mmol/L (ref 3.5–5.1)
Sodium: 135 mmol/L (ref 135–145)

## 2018-11-23 MED ORDER — IOHEXOL 300 MG/ML  SOLN
100.0000 mL | Freq: Once | INTRAMUSCULAR | Status: AC | PRN
  Administered 2018-11-23: 100 mL via INTRAVENOUS

## 2018-11-23 NOTE — Discharge Instructions (Addendum)
You were provided with a copy of your CT results, please schedule follow-up with your primary care physician for these results.  Please follow-up with ENT at your earliest convenience.

## 2018-11-23 NOTE — ED Triage Notes (Addendum)
Patient arrived from home stating she has a "lump" on her neck. Concerned because her mother had throat cancer Denies difficulty swallowing, airway patent
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
## 2018-11-23 NOTE — ED Notes (Signed)
Patient verbalized understanding of dc instructions, vss, ambulatory with nad.   

## 2018-11-23 NOTE — ED Provider Notes (Signed)
  Physical Exam  BP 114/85 (BP Location: Right Arm)   Pulse 81   Temp 98.9 F (37.2 C) (Oral)   Resp 16   SpO2 100%   Physical Exam  ED Course/Procedures     Procedures  MDM   Patient care received from [facility] at shift change, please see his note for full HPI.  Recently, patient with a chest mass, approximately noted 2 weeks ago.  Does have a family history of thyroid CA.  Pending evaluation with CT of her neck.  CT soft tissue showed: 1. Mild focal swelling at the low left paramedian neck/chest wall  with suggestion of a small somewhat ill-defined soft tissue nodular  density within the subcutaneous soft tissues measuring approximately  12 x 7 mm. Finding is non-specific, but could reflect a small  complex sebaceous cyst or possibly small focus of  infection/cellulitis. No discrete abscess or drainable fluid  collection. Clinical follow-up to resolution recommended.  2. Mildly prominent 9 mm left supraclavicular node, nonspecific, but  may be reactive.  3. No other acute abnormality identified within the neck.     CT chest with contrast was remarkable for: 1. Study limited by streak artifact from the patient's arms.  2. Apparent skin thickening with a possible subcutaneous nodule  involving the low anterior neck as detailed above. This is of  unknown clinical significance and is not well evaluated on this  exam. Please see separate CT neck dictation for further details. An  ultrasound may be useful for further evaluation as clinically  indicated.  3. Mildly enlarged left supraclavicular and axillary lymph nodes of  unknown clinical significance.  4. There is a 4 mm pulmonary nodule in the lingula as detailed  above. No follow-up needed if patient is low-risk. Non-contrast  chest CT can be considered in 12 months if patient is high-risk.  This recommendation follows the consensus statement: Guidelines for  Management of Incidental Pulmonary Nodules Detected on  CT Images:  From the Fleischner Society 2017; Radiology 2017; 284:228-243.  5. Probable dilatation of the common bile duct of unknown clinical  significance. The duct appears to taper normally but is not fully  characterized on this exam. Follow-up with a nonemergent outpatient  ultrasound is recommended for further evaluation.     Discussed results with my attending Dr. Sedonia Small who recommended outpatient follow up.  Results have been explained to patient at length.  She is nontoxic-appearing, will give her a referral for ENT for follow-up.  Return precautions discussed at length.  Portions of this note were generated with [employer]. Dictation errors may occur despite best attempts at proofreading.       Janeece Fitting, PA-C 11/23/18 2156    Maudie Flakes, MD 11/27/18 [phone]

## 2019-03-06 ENCOUNTER — Emergency Department (HOSPITAL_COMMUNITY)
Admission: EM | Admit: 2019-03-06 | Discharge: 2019-03-06 | Disposition: A | Discharge status: Home / Self Care | Payer: BC Managed Care – PPO | Attending: Emergency Medicine | Admitting: Emergency Medicine

## 2019-03-06 ENCOUNTER — Emergency Department (HOSPITAL_COMMUNITY): Payer: BC Managed Care – PPO

## 2019-03-06 ENCOUNTER — Other Ambulatory Visit: Payer: Self-pay

## 2019-03-06 ENCOUNTER — Encounter (HOSPITAL_COMMUNITY): Payer: Self-pay

## 2019-03-06 DIAGNOSIS — Z20828 Contact with and (suspected) exposure to other viral communicable diseases: Secondary | ICD-10-CM | POA: Insufficient documentation

## 2019-03-06 DIAGNOSIS — R05 Cough: Secondary | ICD-10-CM | POA: Insufficient documentation

## 2019-03-06 DIAGNOSIS — F17210 Nicotine dependence, cigarettes, uncomplicated: Secondary | ICD-10-CM | POA: Diagnosis not present

## 2019-03-06 DIAGNOSIS — Z20822 Contact with and (suspected) exposure to covid-19: Secondary | ICD-10-CM

## 2019-03-06 DIAGNOSIS — R059 Cough, unspecified: Secondary | ICD-10-CM

## 2019-03-06 MED ORDER — HYDROCODONE-HOMATROPINE 5-1.5 MG/5ML PO SYRP: 5.0000 mL | ORAL_SOLUTION | Freq: Four times a day (QID) | ORAL | 0 refills | Status: DC | PRN
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
## 2019-03-06 NOTE — ED Triage Notes (Signed)
Pt reports productive cough with white sputum since Wednesday. Denies known COVID exposure. Resp e/u, denies fever

## 2019-03-06 NOTE — Discharge Instructions (Signed)
Take medications as prescribed.  If you develop high fever, chest pain, shortness of breath, coughing up blood please seek reevaluation emergency department.

## 2019-03-07 LAB — SARS CORONAVIRUS 2 (TAT 6-24 HRS): SARS Coronavirus 2: NEGATIVE

## 2019-05-21 ENCOUNTER — Other Ambulatory Visit: Payer: Self-pay

## 2019-05-21 ENCOUNTER — Ambulatory Visit (INDEPENDENT_AMBULATORY_CARE_PROVIDER_SITE_OTHER): Payer: BC Managed Care – PPO | Admitting: Pulmonary Disease

## 2019-05-21 ENCOUNTER — Encounter: Payer: Self-pay | Admitting: Pulmonary Disease

## 2019-05-21 VITALS — BP 120/66 | HR 59 | Temp 97.3°F | Ht 67.0 in | Wt 144.4 lb

## 2019-05-21 DIAGNOSIS — Z720 Tobacco use: Secondary | ICD-10-CM | POA: Insufficient documentation

## 2019-05-21 DIAGNOSIS — F17210 Nicotine dependence, cigarettes, uncomplicated: Secondary | ICD-10-CM

## 2019-05-21 DIAGNOSIS — J42 Unspecified chronic bronchitis: Secondary | ICD-10-CM | POA: Insufficient documentation

## 2019-05-21 DIAGNOSIS — R911 Solitary pulmonary nodule: Secondary | ICD-10-CM | POA: Diagnosis not present

## 2019-05-21 DIAGNOSIS — IMO0001 Reserved for inherently not codable concepts without codable children: Secondary | ICD-10-CM

## 2019-05-21 MED ORDER — BREO ELLIPTA 200-25 MCG/INH IN AEPB: 1.0000 | INHALATION_SPRAY | Freq: Every day | RESPIRATORY_TRACT | 0 refills | Status: DC

## 2019-05-21 MED ORDER — BREO ELLIPTA 200-25 MCG/INH IN AEPB: 1.0000 | INHALATION_SPRAY | Freq: Every day | RESPIRATORY_TRACT | 3 refills | Status: DC

## 2019-05-21 NOTE — Progress Notes (Signed)
Subjective:   PATIENT ID: [name] GENDER: female DOB: 12-Jan-1976, MRN: [id number]   HPI  Chief Complaint  Patient presents with  . Consult    referred for lung nodule (11/2018 CT).     Reason for Visit: New consult for pulmonary lung nodule  Ms. [name] is a 44 year old female active smoker with history of bronchitis and eczema on Dupixent who presents for consultation regarding a pulmonary lung nodule.  On review of CareEverywhere and faxed documentation, she was referred to Pulmonary clinic for a pulmonary nodule incidentally seen on chest imaging during work-up neck swelling.  She had a CT Neck and Chest on 11/23/18 which demonstrated a lingular lung nodule measuring 70mm. Also noted to have 43mm left supraclavicular lymph node with an area of soft tissue density that may represent complex cyst of infection.  Since treatment with antibiotics, her neck swelling has resolved. She has baseline shortness of breath and is unable to walk up a flight of stairs without feeling breathless. She works at [employer] and has to be active and she finds it difficult at times. She sometimes has wheezing and occasional cough. The cough was previously productive but stopped once she she was started on Dupixent last year for severe eczema exacerbated by grass, dogs and food allergies. She uses flonase as needed. She is still an active smoker, smoking 1-5 mild cigars daily. She has tried patches in the past but is consistent. She has an inhaler and doesn't know when to to use it.   Social History: Active smoker  I have personally reviewed patient's past medical/family/social history, allergies, current medications.  Past Medical History:  Diagnosis Date  . Bronchitis   . Eczema      Family History  Problem Relation Age of Onset  . Throat cancer Mother      Social History   Occupational History  . Not on file  Tobacco Use  . Smoking status: Current Every Day Smoker   Packs/day: 1.00    Years: 20.00    Pack years: 20.00    Types: Cigarettes  . Smokeless tobacco: Never Used  Substance and Sexual Activity  . Alcohol use: Yes    Comment: occ  . Drug use: No  . Sexual activity: Not on file    Allergies  Allergen Reactions  . Atarax [Hydroxyzine] Swelling  . Betadine [Povidone Iodine] Other (See Comments)    Unknown   . Penicillins Itching    Has patient had a PCN reaction causing immediate rash, facial/tongue/throat swelling, SOB or lightheadedness with hypotension: {Yes Has patient had a PCN reaction causing severe rash involving mucus membranes or skin necrosis:no Has patient had a PCN reaction that required hospitalization no Has patient had a PCN reaction occurring within the last 10 years: no If all of the above answers are "NO", then may proceed with Cephalosporin use.     Outpatient Medications Prior to Visit  Medication Sig Dispense Refill  . dupilumab (DUPIXENT) 200 MG/1.14ML prefilled syringe Inject 200 mg into the skin every 14 (fourteen) days.    . diphenhydrAMINE (BENADRYL) 25 MG tablet Take 25 mg by mouth every 6 (six) hours as needed for allergies.    Marland Kitchen HYDROcodone-homatropine (HYCODAN) 5-1.5 MG/5ML syrup Take 5 mLs by mouth every 6 (six) hours as needed for cough. (Patient not taking: Reported on 05/21/2019) 120 mL 0  . ibuprofen (ADVIL) 200 MG tablet Take 800 mg by mouth every 6 (six) hours as needed for moderate  pain.     No facility-administered medications prior to visit.    Review of Systems  Constitutional: Negative for chills, diaphoresis, fever, malaise/fatigue and weight loss.  HENT: Positive for congestion. Negative for ear pain and sore throat.   Respiratory: Positive for cough, sputum production and shortness of breath. Negative for hemoptysis and wheezing.   Cardiovascular: Negative for chest pain, palpitations and leg swelling.  Gastrointestinal: Negative for abdominal pain, heartburn and nausea.  Genitourinary:  Negative for frequency.  Musculoskeletal: Negative for joint pain and myalgias.  Skin: Negative for itching and rash.  Neurological: Negative for dizziness, weakness and headaches.  Endo/Heme/Allergies: Does not bruise/bleed easily.  Psychiatric/Behavioral: Negative for depression. The patient is not nervous/anxious.      Objective:   Vitals:   05/21/19 0955  BP: 120/66  Pulse: (!) 59  Temp: (!) 97.3 F (36.3 C)  TempSrc: Temporal  SpO2: 100%  Weight: 144 lb 6.4 oz (65.5 kg)  Height: 5\' 7"  (1.702 m)   SpO2: 100 % O2 Device: None (Room air)  Physical Exam: General: Well-appearing, no acute distress HENT: [place], AT Eyes: EOMI, no scleral icterus Respiratory: Diminished breath sounds bilaterally. No crackles, wheezing or rales Cardiovascular: RRR, -M/R/G, no JVD Extremities:-Edema,-tenderness Neuro: AAO x4, CNII-XII grossly intact Skin: Intact, no rashes or bruising Psych: Normal mood, normal affect  Data Reviewed:  Imaging: CT Neck and Chest on 11/23/18 which demonstrated a lingular lung nodule measuring 22mm. Also noted to have 23mm left supraclavicular lymph node with an area of soft tissue density that may represent complex cyst of infection.  PFT: None on record  Imaging, labs and tests noted above have been reviewed independently by me.    Assessment & Plan:   Discussion: 44 year old female active smoker with history of bronchitis and eczema on Dupixent who presents for consultation regarding a pulmonary lung nodule.  Reviewed CT chest in clinic with patient. Calculated risk of lung cancer with 46 solitary lung nodule tool which demonstrated less than 5% risk.  With her history of smoking and respiratory symptoms, I suspect she has obstructive lung disease.  We will start LABA/ICS as she also has history of allergies and eczema requiring Dupixent.  Lung nodule  This is not likely likely cancer based on your risk factors.  Chronic bronchitis START Breo 200-25 mcg  ONE puff DAILY Will arrange pulmonary function test prior to next visit  Tobacco abuse Patient is an active smoker. We discussed smoking cessation for 10 minutes. We discussed triggers and stressors and ways to deal with them. We discussed barriers to continued smoking and benefits of smoking cessation. Provided patient with information cessation techniques and interventions including [place] quitline.  Health Maintenance Immunization History  Administered Date(s) Administered  . Tdap 02/16/2013   CT Lung Screen - not qualified  Orders Placed This Encounter  Procedures  . Pulmonary function test    Standing Status:   Future    Standing Expiration Date:   05/20/2020    Order Specific Question:   Where should this test be performed?    Answer:   [facility]    Order Specific Question:   Full PFT: includes the following: basic spirometry, spirometry pre & post bronchodilator, diffusion capacity (DLCO), lung volumes    Answer:   Full PFT    Order Specific Question:   MIP/MEP    Answer:   No    Order Specific Question:   6 minute walk    Answer:   No  Order Specific Question:   ABG    Answer:   No    Order Specific Question:   Diffusion capacity (DLCO)    Answer:   Yes    Order Specific Question:   Lung volumes    Answer:   Yes    Order Specific Question:   Methacholine challenge    Answer:   No   Meds ordered this encounter  Medications  . fluticasone furoate-vilanterol (BREO ELLIPTA) 200-25 MCG/INH AEPB    Sig: Inhale 1 puff into the lungs daily.    Dispense:  2 each    Refill:  0    Order Specific Question:   Lot Number?    Answer:   [id number]    Order Specific Question:   Expiration Date?    Answer:   07/16/2019    Order Specific Question:   Manufacturer?    Answer:   GlaxoSmithKline [12]    Order Specific Question:   Quantity    Answer:   2  . fluticasone furoate-vilanterol (BREO ELLIPTA) 200-25 MCG/INH AEPB    Sig: Inhale 1 puff into the lungs daily.    Dispense:  60  each    Refill:  3    Return in about 3 months (around 08/19/2019).  I have spent 45-minutes charting, coordinating care and discussing medical diagnosis, plan and counseling with the patient/family as noted above.  Ulonda Klosowski Mechele Collin, MD  Pulmonary Critical Care 05/21/2019 10:44 AM  Office Number [phone]

## 2019-05-21 NOTE — Patient Instructions (Addendum)
Lung nodule  This is not likely likely cancer based on your risk factors.  Chronic bronchitis START Breo Will arrange pulmonary function test prior to next visit  Tobacco abuse Patient is an active smoker. We discussed smoking cessation for 10 minutes. We discussed triggers and stressors and ways to deal with them. We discussed barriers to continued smoking and benefits of smoking cessation. Provided patient with information cessation techniques and interventions including [place] quitline.    Smoking Tobacco Information, Adult Smoking tobacco can be harmful to your health. Tobacco contains a poisonous (toxic), colorless chemical called nicotine. Nicotine is addictive. It changes the brain and can make it hard to stop smoking. Tobacco also has other toxic chemicals that can hurt your body and raise your risk of many cancers. How can smoking tobacco affect me? Smoking tobacco puts you at risk for:  Cancer. Smoking is most commonly associated with lung cancer, but can also lead to cancer in other parts of the body.  Chronic obstructive pulmonary disease (COPD). This is a long-term lung condition that makes it hard to breathe. It also gets worse over time.  High blood pressure (hypertension), heart disease, stroke, or heart attack.  Lung infections, such as pneumonia.  Cataracts. This is when the lenses in the eyes become clouded.  Digestive problems. This may include peptic ulcers, heartburn, and gastroesophageal reflux disease (GERD).  Oral health problems, such as gum disease and tooth loss.  Loss of taste and smell. Smoking can affect your appearance by causing:  Wrinkles.  Yellow or stained teeth, fingers, and fingernails. Smoking tobacco can also affect your social life, because:  It may be challenging to find places to smoke when away from home. Many workplaces, [employer], hotels, and public places are tobacco-free.  Smoking is expensive. This is due to the cost of tobacco  and the long-term costs of treating health problems from smoking.  Secondhand smoke may affect those around you. Secondhand smoke can cause lung cancer, breathing problems, and heart disease. Children of smokers have a higher risk for: ? Sudden infant death syndrome (SIDS). ? Ear infections. ? Lung infections. If you currently smoke tobacco, quitting now can help you:  Lead a longer and healthier life.  Look, smell, breathe, and feel better over time.  Save money.  Protect others from the harms of secondhand smoke. What actions can I take to prevent health problems? Quit smoking   Do not start smoking. Quit if you already do.  Make a plan to quit smoking and commit to it. Look for programs to help you and ask your health care provider for recommendations and ideas.  Set a date and write down all the reasons you want to quit.  Let your friends and family know you are quitting so they can help and support you. Consider finding friends who also want to quit. It can be easier to quit with someone else, so that you can support each other.  Talk with your health care provider about using nicotine replacement medicines to help you quit, such as gum, lozenges, patches, sprays, or pills.  Do not replace cigarette smoking with electronic cigarettes, which are commonly called e-cigarettes. The safety of e-cigarettes is not known, and some may contain harmful chemicals.  If you try to quit but return to smoking, stay positive. It is common to slip up when you first quit, so take it one day at a time.  Be prepared for cravings. When you feel the urge to smoke, chew gum  or suck on hard candy. Lifestyle  Stay busy and take care of your body.  Drink enough fluid to keep your urine pale yellow.  Get plenty of exercise and eat a healthy diet. This can help prevent weight gain after quitting.  Monitor your eating habits. Quitting smoking can cause you to have a larger appetite than when you  smoke.  Find ways to relax. Go out with friends or family to a movie or a restaurant where people do not smoke.  Ask your health care provider about having regular tests (screenings) to check for cancer. This may include blood tests, imaging tests, and other tests.  Find ways to manage your stress, such as meditation, yoga, or exercise. Where to find support To get support to quit smoking, consider:  Asking your health care provider for more information and resources.  Taking classes to learn more about quitting smoking.  Looking for local organizations that offer resources about quitting smoking.  Joining a support group for people who want to quit smoking in your local community.  Calling the smokefree.gov counselor helpline: 1-800-Quit-Now (226)061-9280) Where to find more information You may find more information about quitting smoking from:  HelpGuide.org: www.helpguide.org  BankRights[employer]: smokefree.gov  American Lung Association: www.lung.org Contact a health care provider if you:  Have problems breathing.  Notice that your lips, nose, or fingers turn blue.  Have chest pain.  Are coughing up blood.  Feel faint or you pass out.  Have other health changes that cause you to worry. Summary  Smoking tobacco can negatively affect your health, the health of those around you, your finances, and your social life.  Do not start smoking. Quit if you already do. If you need help quitting, ask your health care provider.  Think about joining a support group for people who want to quit smoking in your local community. There are many effective programs that will help you to quit this behavior. This information is not intended to replace advice given to you by your health care provider. Make sure you discuss any questions you have with your health care provider. Document Revised: 01/26/2019 Document Reviewed: 05/18/2016 Elsevier Patient Education  2020 [employer].

## 2019-09-25 ENCOUNTER — Ambulatory Visit: Payer: BC Managed Care – PPO | Attending: Internal Medicine

## 2019-09-25 ENCOUNTER — Ambulatory Visit: Payer: Self-pay

## 2019-09-25 DIAGNOSIS — Z23 Encounter for immunization: Secondary | ICD-10-CM

## 2019-09-25 NOTE — Progress Notes (Signed)
   Covid-19 Vaccination Clinic  Name:  [name]    MRN: [id number] DOB: [birth year]  09/25/2019  [name] was observed post Covid-19 immunization for 15 minutes without incident. She was provided with Vaccine Information Sheet and instruction to access the V-Safe system.   [name] was instructed to call 911 with any severe reactions post vaccine: Marland Kitchen Difficulty breathing  . Swelling of face and throat  . A fast heartbeat  . A bad rash all over body  . Dizziness and weakness   Immunizations Administered    Name Date Dose VIS Date Route   Pfizer COVID-19 Vaccine 09/25/2019  4:32 PM 0.3 mL 07/11/2018 Intramuscular   Manufacturer: [employer], [employer]   Lot: [id number]   NDC: [address]-1

## 2019-10-16 ENCOUNTER — Ambulatory Visit: Payer: BC Managed Care – PPO | Attending: Internal Medicine

## 2019-10-16 DIAGNOSIS — Z23 Encounter for immunization: Secondary | ICD-10-CM

## 2019-10-16 NOTE — Progress Notes (Signed)
   Covid-19 Vaccination Clinic  Name:  [name]    MRN: [id number] DOB: 1975-09-20  10/16/2019  [name] was observed post Covid-19 immunization for 30 minutes based on pre-vaccination screening without incident. She was provided with Vaccine Information Sheet and instruction to access the V-Safe system.   [name] was instructed to call 911 with any severe reactions post vaccine: Marland Kitchen Difficulty breathing  . Swelling of face and throat  . A fast heartbeat  . A bad rash all over body  . Dizziness and weakness   Immunizations Administered    Name Date Dose VIS Date Route   Pfizer COVID-19 Vaccine 10/16/2019  9:01 AM 0.3 mL 07/11/2018 Intramuscular   Manufacturer: [employer], [employer]   Lot: [id number]   NDC: [address]-6

## 2020-04-21 ENCOUNTER — Other Ambulatory Visit: Payer: Self-pay

## 2020-04-21 ENCOUNTER — Encounter (HOSPITAL_COMMUNITY): Payer: Self-pay

## 2020-04-21 ENCOUNTER — Emergency Department (HOSPITAL_COMMUNITY): Payer: BC Managed Care – PPO

## 2020-04-21 ENCOUNTER — Emergency Department (HOSPITAL_COMMUNITY)
Admission: EM | Admit: 2020-04-21 | Discharge: 2020-04-22 | Disposition: A | Discharge status: Home / Self Care | Payer: BC Managed Care – PPO | Attending: Emergency Medicine | Admitting: Emergency Medicine

## 2020-04-21 DIAGNOSIS — M79644 Pain in right finger(s): Secondary | ICD-10-CM

## 2020-04-21 DIAGNOSIS — M25519 Pain in unspecified shoulder: Secondary | ICD-10-CM

## 2020-04-21 DIAGNOSIS — F17210 Nicotine dependence, cigarettes, uncomplicated: Secondary | ICD-10-CM | POA: Insufficient documentation

## 2020-04-21 DIAGNOSIS — Z20822 Contact with and (suspected) exposure to covid-19: Secondary | ICD-10-CM | POA: Insufficient documentation

## 2020-04-21 DIAGNOSIS — X58XXXA Exposure to other specified factors, initial encounter: Secondary | ICD-10-CM | POA: Diagnosis not present

## 2020-04-21 DIAGNOSIS — S4991XA Unspecified injury of right shoulder and upper arm, initial encounter: Secondary | ICD-10-CM | POA: Diagnosis not present

## 2020-04-21 DIAGNOSIS — R059 Cough, unspecified: Secondary | ICD-10-CM | POA: Diagnosis not present

## 2020-04-21 DIAGNOSIS — S60944A Unspecified superficial injury of right ring finger, initial encounter: Secondary | ICD-10-CM | POA: Insufficient documentation

## 2020-04-21 NOTE — ED Triage Notes (Signed)
Patient from home, reports she works for [employer] and hurt her shoulder, complaints of R shoulder pain and 4th finger pain, also states she has a cough and wants "narcotic cough medicine".

## 2020-04-22 LAB — RESP PANEL BY RT-PCR (FLU A&B, COVID) ARPGX2
Influenza A by PCR: NEGATIVE
Influenza B by PCR: NEGATIVE
SARS Coronavirus 2 by RT PCR: NEGATIVE

## 2020-04-22 MED ORDER — BENZONATATE 100 MG PO CAPS: 100.0000 mg | ORAL_CAPSULE | Freq: Three times a day (TID) | ORAL | 0 refills | Status: DC

## 2020-04-22 MED ORDER — GUAIFENESIN 100 MG/5ML PO SYRP
100.0000 mg | ORAL_SOLUTION | ORAL | 0 refills | Status: DC | PRN
Start: 2020-04-22 — End: 2020-10-21

## 2020-04-22 MED ORDER — MELOXICAM 15 MG PO TABS: 15.0000 mg | ORAL_TABLET | Freq: Every day | ORAL | 0 refills | Status: DC
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
## 2020-04-22 NOTE — Discharge Instructions (Signed)
Please read and follow all provided instructions.  Your diagnoses today include:  1. Shoulder pain   2. Pain of finger of right hand   3. Cough     Tests performed today include:  An x-ray of the affected areas - do NOT show any broken bones  COVID test - pending results, will be available later today  Vital signs. See below for your results today.   Medications prescribed:   Tessalon Perles - cough suppressant medication   Meloxicam - anti-inflammatory pain medication  You have been prescribed an anti-inflammatory medication or NSAID. Take with food. Do not take aspirin, ibuprofen, or naproxen if taking this medication. Take smallest effective dose for the shortest duration needed for your pain. Stop taking if you experience stomach pain or vomiting.   Take any prescribed medications only as directed.  Home care instructions:   Follow any educational materials contained in this packet  Follow R.I.C.E. Protocol:  R - rest your injury   I  - use ice on injury without applying directly to skin  C - compress injury with bandage or splint  E - elevate the injury as much as possible  Follow-up instructions: Please follow-up with your primary care provider if you continue to have significant pain in 1 week. In this case you may have a more severe injury that requires further care.   Return instructions:   Please return if your fingers are numb or tingling, appear gray or blue, or you have severe pain (also elevate the arm and loosen splint or wrap if you were given one)  Please return to the Emergency Department if you experience worsening symptoms.   Please return if you have any other emergent concerns.  Additional Information:  Your vital signs today were: BP 119/80 (BP Location: Left Arm)   Pulse 82   Temp 98.7 F (37.1 C) (Oral)   Resp 15   Ht 5' 7.25" (1.708 m)   Wt 64 kg   LMP 03/23/2020   SpO2 100%   BMI 21.93 kg/m  If your blood pressure (BP) was  elevated above 135/85 this visit, please have this repeated by your doctor within one month. --------------

## 2020-04-22 NOTE — ED Notes (Signed)
Pt rejected VS reassessment  

## 2020-04-22 NOTE — ED Notes (Signed)
Pt stated she was going to the cafeteria for breakfast

## 2020-04-22 NOTE — ED Notes (Signed)
Pt refused vitals because she stated it violated it her privacy rights

## 2020-04-22 NOTE — ED Notes (Signed)
Pt refused discharge vitals 

## 2020-10-20 ENCOUNTER — Other Ambulatory Visit: Payer: Self-pay

## 2020-10-20 ENCOUNTER — Encounter (HOSPITAL_COMMUNITY): Payer: Self-pay | Admitting: Registered Nurse

## 2020-10-20 ENCOUNTER — Ambulatory Visit (HOSPITAL_COMMUNITY)
Admission: EM | Admit: 2020-10-20 | Discharge: 2020-10-21 | Disposition: A | Discharge status: Home / Self Care | Payer: BC Managed Care – PPO | Attending: Registered Nurse | Admitting: Registered Nurse

## 2020-10-20 DIAGNOSIS — F332 Major depressive disorder, recurrent severe without psychotic features: Secondary | ICD-10-CM

## 2020-10-20 DIAGNOSIS — Z59811 Housing instability, housed, with risk of homelessness: Secondary | ICD-10-CM | POA: Diagnosis not present

## 2020-10-20 DIAGNOSIS — Z733 Stress, not elsewhere classified: Secondary | ICD-10-CM | POA: Diagnosis not present

## 2020-10-20 DIAGNOSIS — F17210 Nicotine dependence, cigarettes, uncomplicated: Secondary | ICD-10-CM | POA: Diagnosis not present

## 2020-10-20 DIAGNOSIS — Z20822 Contact with and (suspected) exposure to covid-19: Secondary | ICD-10-CM | POA: Insufficient documentation

## 2020-10-20 DIAGNOSIS — R45851 Suicidal ideations: Secondary | ICD-10-CM

## 2020-10-20 DIAGNOSIS — Z880 Allergy status to penicillin: Secondary | ICD-10-CM | POA: Diagnosis not present

## 2020-10-20 HISTORY — DX: Major depressive disorder, recurrent severe without psychotic features: F33.2

## 2020-10-20 LAB — CBC WITH DIFFERENTIAL/PLATELET
Abs Immature Granulocytes: 0.01 10*3/uL (ref 0.00–0.07)
Basophils Absolute: 0 10*3/uL (ref 0.0–0.1)
Basophils Relative: 1 %
Eosinophils Absolute: 0.2 10*3/uL (ref 0.0–0.5)
Eosinophils Relative: 4 %
HCT: 35.5 % — ABNORMAL LOW (ref 36.0–46.0)
Hemoglobin: 11.9 g/dL — ABNORMAL LOW (ref 12.0–15.0)
Immature Granulocytes: 0 %
Lymphocytes Relative: 27 %
Lymphs Abs: 1.6 10*3/uL (ref 0.7–4.0)
MCH: 27.3 pg (ref 26.0–34.0)
MCHC: 33.5 g/dL (ref 30.0–36.0)
MCV: 81.4 fL (ref 80.0–100.0)
Monocytes Absolute: 0.7 10*3/uL (ref 0.1–1.0)
Monocytes Relative: 12 %
Neutro Abs: 3.3 10*3/uL (ref 1.7–7.7)
Neutrophils Relative %: 56 %
Platelets: 451 10*3/uL — ABNORMAL HIGH (ref 150–400)
RBC: 4.36 MIL/uL (ref 3.87–5.11)
RDW: 18.7 % — ABNORMAL HIGH (ref 11.5–15.5)
WBC: 5.7 10*3/uL (ref 4.0–10.5)
nRBC: 0 % (ref 0.0–0.2)

## 2020-10-20 LAB — POCT URINE DRUG SCREEN - MANUAL ENTRY (I-SCREEN)
POC Amphetamine UR: NOT DETECTED
POC Buprenorphine (BUP): NOT DETECTED
POC Cocaine UR: NOT DETECTED
POC Marijuana UR: POSITIVE — AB
POC Methadone UR: NOT DETECTED
POC Methamphetamine UR: NOT DETECTED
POC Morphine: NOT DETECTED
POC Oxazepam (BZO): NOT DETECTED
POC Oxycodone UR: NOT DETECTED
POC Secobarbital (BAR): NOT DETECTED

## 2020-10-20 LAB — URINALYSIS, ROUTINE W REFLEX MICROSCOPIC
Bilirubin Urine: NEGATIVE
Glucose, UA: NEGATIVE mg/dL
Ketones, ur: NEGATIVE mg/dL
Leukocytes,Ua: NEGATIVE
Nitrite: NEGATIVE
Protein, ur: NEGATIVE mg/dL
Specific Gravity, Urine: 1.017 (ref 1.005–1.030)
pH: 6 (ref 5.0–8.0)

## 2020-10-20 LAB — TSH: TSH: 1.529 u[IU]/mL (ref 0.350–4.500)

## 2020-10-20 LAB — POCT PREGNANCY, URINE: Preg Test, Ur: NEGATIVE

## 2020-10-20 LAB — LIPID PANEL
Cholesterol: 136 mg/dL (ref 0–200)
HDL: 79 mg/dL (ref >40)
LDL Cholesterol: 30 mg/dL (ref 0–99)
Total CHOL/HDL Ratio: 1.7 RATIO
Triglycerides: 137 mg/dL (ref <150)
VLDL: 27 mg/dL (ref 0–40)

## 2020-10-20 LAB — COMPREHENSIVE METABOLIC PANEL
ALT: 16 U/L (ref 0–44)
AST: 26 U/L (ref 15–41)
Albumin: 4.2 g/dL (ref 3.5–5.0)
Alkaline Phosphatase: 58 U/L (ref 38–126)
Anion gap: 9 (ref 5–15)
BUN: 6 mg/dL (ref 6–20)
CO2: 23 mmol/L (ref 22–32)
Calcium: 9.5 mg/dL (ref 8.9–10.3)
Chloride: 104 mmol/L (ref 98–111)
Creatinine, Ser: 0.8 mg/dL (ref 0.44–1.00)
GFR, Estimated: >60 mL/min (ref >60)
Glucose, Bld: 90 mg/dL (ref 70–99)
Potassium: 4.2 mmol/L (ref 3.5–5.1)
Sodium: 136 mmol/L (ref 135–145)
Total Bilirubin: 0.5 mg/dL (ref 0.3–1.2)
Total Protein: 7.4 g/dL (ref 6.5–8.1)

## 2020-10-20 LAB — POC SARS CORONAVIRUS 2 AG: SARSCOV2ONAVIRUS 2 AG: NEGATIVE

## 2020-10-20 LAB — RESP PANEL BY RT-PCR (FLU A&B, COVID) ARPGX2
Influenza A by PCR: NEGATIVE
Influenza B by PCR: NEGATIVE
SARS Coronavirus 2 by RT PCR: NEGATIVE

## 2020-10-20 LAB — PREGNANCY, URINE: Preg Test, Ur: NEGATIVE

## 2020-10-20 LAB — MAGNESIUM: Magnesium: 2 mg/dL (ref 1.7–2.4)

## 2020-10-20 LAB — ETHANOL: Alcohol, Ethyl (B): <10 mg/dL (ref <10)

## 2020-10-20 MED ORDER — SERTRALINE HCL 25 MG PO TABS
25.0000 mg | ORAL_TABLET | Freq: Once | ORAL | Status: AC
  Administered 2020-10-20: 25 mg via ORAL
  Filled 2020-10-20: qty 1

## 2020-10-20 MED ORDER — MAGNESIUM HYDROXIDE 400 MG/5ML PO SUSP: 30.0000 mL | Freq: Every day | ORAL | Status: DC | PRN

## 2020-10-20 MED ORDER — ACETAMINOPHEN 325 MG PO TABS: 650.0000 mg | ORAL_TABLET | Freq: Four times a day (QID) | ORAL | Status: DC | PRN

## 2020-10-20 MED ORDER — TRAZODONE HCL 50 MG PO TABS: 50.0000 mg | ORAL_TABLET | Freq: Every evening | ORAL | Status: DC | PRN

## 2020-10-20 MED ORDER — ALUM & MAG HYDROXIDE-SIMETH 200-200-20 MG/5ML PO SUSP: 30.0000 mL | ORAL | Status: DC | PRN

## 2020-10-20 NOTE — ED Notes (Signed)
Pt sleeping at present, no distress noted, pt SI, monitoring for safety.
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
## 2020-10-20 NOTE — ED Notes (Signed)
Pt sleeping at present, no distress noted, monitoring for safety. 

## 2020-10-20 NOTE — Discharge Instructions (Addendum)
Please pick up your medication from your pharmacy and follow up with your out patient appointments.
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
## 2020-10-21 ENCOUNTER — Inpatient Hospital Stay (HOSPITAL_COMMUNITY): Admission: AD | Admit: 2020-10-21 | Payer: BC Managed Care – PPO | Source: Intra-hospital | Admitting: Psychiatry

## 2020-10-21 DIAGNOSIS — F332 Major depressive disorder, recurrent severe without psychotic features: Secondary | ICD-10-CM

## 2020-10-21 LAB — HEMOGLOBIN A1C
Hgb A1c MFr Bld: 5 % (ref 4.8–5.6)
Mean Plasma Glucose: 97 mg/dL

## 2020-10-21 MED ORDER — SERTRALINE HCL 25 MG PO TABS: 25.0000 mg | ORAL_TABLET | Freq: Every day | ORAL | Status: DC

## 2020-10-21 MED ORDER — SERTRALINE HCL 25 MG PO TABS
25.0000 mg | ORAL_TABLET | Freq: Every day | ORAL | Status: DC
  Administered 2020-10-21: 25 mg via ORAL
  Filled 2020-10-21: qty 1

## 2020-10-21 MED ORDER — SERTRALINE HCL 25 MG PO TABS
25.0000 mg | ORAL_TABLET | Freq: Every day | ORAL | 0 refills | Status: DC
Start: 2020-10-22 — End: 2021-06-01

## 2020-10-21 NOTE — Progress Notes (Signed)
CSW spoke with clinical team regarding the bed placement at [facility]. Per Dr. Renaldo Fiddler, the Pt has requested to return home with support and has asked for the [facility] at [facility] to discontinuing holding the bed for the Pt. The Pt is being prepared for discharge.   Damita Dunnings, MSW, LCSW-A  1:11 PM 10/21/2020
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
## 2020-10-21 NOTE — ED Notes (Signed)
Pt sleeping at present, no distress noted, monitoring for safety. 

## 2020-10-21 NOTE — Discharge Summary (Signed)
[name] to be D/C'd home per DO order. Discussed with the patient and all questions fully answered. An After Visit Summary was printed and given to the patient. Patient escorted out, and D/C home via private auto.  [name]  10/21/2020 1:41 PM
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
## 2020-10-21 NOTE — Progress Notes (Signed)
Patient information has been sent to [facility] via secure chat to review for potential admission. Patient meets inpatient criteria per Rhea Belton, MD.   Situation ongoing, CSW will continue to monitor progress.    Signed:  Damita Dunnings, MSW, LCSW-A  10/21/2020 9:42 AM

## 2020-10-21 NOTE — Progress Notes (Signed)
Pt accepted to [facility]-1.    Patient meets inpatient criteria per Rhea Belton, MD.   Dr. Jola Babinski is the attending provider.    Call report to [phone]    Rex Kras, RN @ [facility] notified.     Pt scheduled  to arrive at [facility] at 1300.   Damita Dunnings, MSW, LCSW-A  11:59 AM 10/21/2020

## 2020-10-21 NOTE — Progress Notes (Signed)
Pt is alert and oriented with blunted affect, Pt is currently asleep. Administered scheduled med with no incident. Pt denies pain, SI/HI/AVH at this time. Staff will monitor for pt's safety.

## 2021-06-01 ENCOUNTER — Encounter: Payer: Self-pay | Admitting: Family

## 2021-06-01 ENCOUNTER — Ambulatory Visit: Payer: BC Managed Care – PPO | Admitting: Family

## 2021-06-01 ENCOUNTER — Other Ambulatory Visit: Payer: Self-pay

## 2021-06-01 VITALS — BP 105/70 | HR 84 | Temp 98.8°F | Ht 67.25 in | Wt 138.4 lb

## 2021-06-01 DIAGNOSIS — J3089 Other allergic rhinitis: Secondary | ICD-10-CM

## 2021-06-01 DIAGNOSIS — F331 Major depressive disorder, recurrent, moderate: Secondary | ICD-10-CM

## 2021-06-01 DIAGNOSIS — Z1231 Encounter for screening mammogram for malignant neoplasm of breast: Secondary | ICD-10-CM

## 2021-06-01 DIAGNOSIS — F17210 Nicotine dependence, cigarettes, uncomplicated: Secondary | ICD-10-CM | POA: Insufficient documentation

## 2021-06-01 DIAGNOSIS — N921 Excessive and frequent menstruation with irregular cycle: Secondary | ICD-10-CM

## 2021-06-01 MED ORDER — FLUTICASONE PROPIONATE 50 MCG/ACT NA SUSP: 2.0000 | Freq: Every day | NASAL | 5 refills | Status: AC

## 2021-06-01 MED ORDER — SERTRALINE HCL 50 MG PO TABS: 50.0000 mg | ORAL_TABLET | Freq: Every day | ORAL | 1 refills | Status: AC

## 2021-06-01 NOTE — Assessment & Plan Note (Addendum)
Chronic - reports related mostly to dust at her work, wears a mask sometimes, reports Flonase helping her symptoms, refilling med today.

## 2021-06-01 NOTE — Assessment & Plan Note (Addendum)
Chronic- reports having a bad episode this past summer and advised to get inpatient therapy, but she has children at home and did not want to do this. She took Zoloft for about a month, but ran out and did not have a PCP to refill. Sending refill today. f/u in 1 month.

## 2021-06-01 NOTE — Assessment & Plan Note (Deleted)
reports having a bad episode this past summer and advised to get inpatient therapy, but she has children at home and did not want to do this. She took Zoloft for about a month, but ran out and did not have a PCP to refill. Sending refill today.
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
## 2021-06-01 NOTE — Assessment & Plan Note (Addendum)
possibly perimenopausal sx - seen by GYN and suggested to have hysterectomy, she wants to hold off, DEPO shots also suggested, but pt remembers this causing her to have irregular bleeding in the past. Recommended progesterone pills that she can take if her cycle is heavy. She will let me know next time it happens. She was told to take an iron pill daily, advised she just take during her cycles if she's having side effects.

## 2021-06-01 NOTE — Assessment & Plan Note (Signed)
has nicotine patches at home, but has not started. Advised pt to let me know if it is a taper pack or 1 dose pack before I can advise her on how to start taking.

## 2021-06-01 NOTE — Patient Instructions (Signed)
Welcome to [facility]! It was a pleasure meeting you today.  I have refilled your Zoloft and Flonase nasal spray.  Please schedule a 6 month follow up visit today for refills. [facility] and they will call you to schedule an appointment. Let me know the dose on your box of Nicotine patches at home. You can call or send a message thru MyChart.    PLEASE NOTE:  If you had any LAB tests please let us know if you have not heard back within a few days. You may see your results on MyChart before we have a chance to review them but we will give you a call once they are reviewed by [place]. If we ordered any REFERRALS today, please let us know if you have not heard from their office within the next week.  Let us know through MyChart if you are needing REFILLS, or have your pharmacy send [place] the request. You can also use MyChart to communicate with me or any office staff.  Please try these tips to maintain a healthy lifestyle:  Eat most of your calories during the day when you are active. Eliminate processed foods including packaged sweets (pies, cakes, cookies), reduce intake of potatoes, white bread, white pasta, and white rice. Look for whole grain options, oat flour or almond flour.  Each meal should contain half fruits/vegetables, one quarter protein, and one quarter carbs (no bigger than a computer mouse).  Cut down on sweet beverages. This includes juice, soda, and sweet tea. Also watch fruit intake, though this is a healthier sweet option, it still contains natural sugar! Limit to 3 servings daily.  Drink at least 1 glass of water with each meal and aim for at least 8 glasses per day  Exercise at least 150 minutes every week.

## 2021-06-01 NOTE — Assessment & Plan Note (Signed)
receives Dupixilent bimonthly injections. reports starting this about 2 years ago and made a huge improvement in sx.

## 2021-06-04 ENCOUNTER — Encounter: Payer: Self-pay | Admitting: Family

## 2021-08-17 ENCOUNTER — Telehealth: Payer: Self-pay | Admitting: Family

## 2021-08-17 NOTE — Telephone Encounter (Signed)
. ?  Encourage patient to contact the pharmacy for refills or they can request refills through [facility] ? ?LAST APPOINTMENT DATE:  06/01/21 ? ?NEXT APPOINTMENT DATE: N/A ? ?MEDICATION:sertraline (ZOLOFT) 50 MG tablet ? ?fluticasone (FLONASE) 50 MCG/ACT nasal spray ? ?Is the patient out of medication? yes ? ?PHARMACY: ?[facility] [phone] - [place], [place] - [address] Phone:  [phone]  ?Fax:  [phone]  ?  ? ? ?Let patient know to contact pharmacy at the end of the day to make sure medication is ready. ? ?Please notify patient to allow 48-72 hours to process  ?

## 2021-08-17 NOTE — Telephone Encounter (Signed)
Pt is requesting a 90 day supply of the Zoloft. She also wanted someone to check on the Flonase. Pharmacy states she has no prescriptions. ?

## 2021-08-18 NOTE — Telephone Encounter (Signed)
the zoloft was sent w/1 refill - if need to send again, just send 90d - needs office visit after that - ok to send flonase- but it had 5 refills also

## 2022-02-28 ENCOUNTER — Other Ambulatory Visit: Payer: Self-pay | Admitting: Family

## 2022-02-28 DIAGNOSIS — F331 Major depressive disorder, recurrent, moderate: Secondary | ICD-10-CM

## 2022-07-19 ENCOUNTER — Encounter (HOSPITAL_COMMUNITY): Payer: Self-pay | Admitting: *Deleted

## 2022-07-19 ENCOUNTER — Other Ambulatory Visit: Payer: Self-pay

## 2022-07-19 ENCOUNTER — Ambulatory Visit (HOSPITAL_COMMUNITY)
Admission: EM | Admit: 2022-07-19 | Discharge: 2022-07-19 | Disposition: A | Discharge status: Home / Self Care | Payer: BC Managed Care – PPO | Attending: Family Medicine | Admitting: Family Medicine

## 2022-07-19 DIAGNOSIS — L309 Dermatitis, unspecified: Secondary | ICD-10-CM

## 2022-07-19 MED ORDER — PREDNISONE 20 MG PO TABS: 40.0000 mg | ORAL_TABLET | Freq: Every day | ORAL | 0 refills | Status: AC

## 2022-07-19 MED ORDER — TRIAMCINOLONE ACETONIDE 0.1 % EX CREA: 1.0000 | TOPICAL_CREAM | Freq: Two times a day (BID) | CUTANEOUS | 1 refills | Status: DC

## 2022-07-19 MED ORDER — TRIAMCINOLONE ACETONIDE 40 MG/ML IJ SUSP
40.0000 mg | Freq: Once | INTRAMUSCULAR | Status: AC
  Administered 2022-07-19: 40 mg via INTRAMUSCULAR

## 2022-07-19 MED ORDER — TRIAMCINOLONE ACETONIDE 40 MG/ML IJ SUSP
INTRAMUSCULAR | Status: AC
  Filled 2022-07-19: qty 1
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
## 2022-07-19 NOTE — Discharge Instructions (Signed)
You have been given a shot of triamcinolone 40 mg, a steroid  Take prednisone 20 mg--2 daily for 5 days  Apply triamcinolone twice daily to the rash areas till improved.  You can continue Benadryl or Chlor-Trimeton as needed for itching.  Those might make you sleepy.  If so, you could take Zyrtec, Claritin, or Allegra instead.

## 2022-07-19 NOTE — ED Triage Notes (Signed)
Pt needs refill of med for Eczema. Pt's Derm . Is no longer in town.

## 2022-07-20 ENCOUNTER — Telehealth (HOSPITAL_COMMUNITY): Payer: Self-pay | Admitting: Emergency Medicine

## 2022-07-20 MED ORDER — TRIAMCINOLONE ACETONIDE 0.1 % EX OINT: 1.0000 | TOPICAL_OINTMENT | Freq: Two times a day (BID) | CUTANEOUS | 1 refills | Status: DC

## 2022-07-20 NOTE — Telephone Encounter (Signed)
Patient needed ointment instead of cream

## 2022-09-10 ENCOUNTER — Ambulatory Visit
Admission: EM | Admit: 2022-09-10 | Discharge: 2022-09-10 | Disposition: A | Discharge status: Home / Self Care | Payer: Self-pay | Attending: Family Medicine | Admitting: Family Medicine

## 2022-09-10 ENCOUNTER — Ambulatory Visit (INDEPENDENT_AMBULATORY_CARE_PROVIDER_SITE_OTHER): Payer: Self-pay

## 2022-09-10 DIAGNOSIS — M7989 Other specified soft tissue disorders: Secondary | ICD-10-CM

## 2022-09-10 MED ORDER — DICLOFENAC SODIUM 75 MG PO TBEC: 75.0000 mg | DELAYED_RELEASE_TABLET | Freq: Two times a day (BID) | ORAL | 0 refills | Status: DC | PRN

## 2022-09-10 MED ORDER — CEPHALEXIN 500 MG PO CAPS: 500.0000 mg | ORAL_CAPSULE | Freq: Three times a day (TID) | ORAL | 0 refills | Status: AC

## 2022-09-10 MED ORDER — KETOROLAC TROMETHAMINE 30 MG/ML IJ SOLN
30.0000 mg | Freq: Once | INTRAMUSCULAR | Status: AC
  Administered 2022-09-10: 30 mg via INTRAMUSCULAR
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
## 2022-09-10 NOTE — Discharge Instructions (Addendum)
There is no problem with the bone in your arm, and there is no calcification in the knot.  Take cephalexin 500 mg--1 capsule 3 times daily for 7 days  Diclofenac 75 mg--1 tablet 2 times daily as needed for pain  You have been given a shot of Toradol 30 mg today.  If this spot enlarges anymore or becomes even more painful, please proceed to the emergency room for further evaluation

## 2022-09-10 NOTE — ED Triage Notes (Signed)
Pt c/o painful knot to lt deltoid where she received a steroid stop to lt deltoid almost 2 months ago. States took aleve, ibuprofen, tylenol with no relief.

## 2022-09-16 ENCOUNTER — Encounter (HOSPITAL_COMMUNITY): Payer: Self-pay

## 2023-08-01 ENCOUNTER — Ambulatory Visit
Admission: EM | Admit: 2023-08-01 | Discharge: 2023-08-01 | Disposition: A | Discharge status: Home / Self Care | Payer: Self-pay | Attending: Family Medicine | Admitting: Family Medicine

## 2023-08-01 DIAGNOSIS — L600 Ingrowing nail: Secondary | ICD-10-CM

## 2023-08-01 DIAGNOSIS — S46911A Strain of unspecified muscle, fascia and tendon at shoulder and upper arm level, right arm, initial encounter: Secondary | ICD-10-CM

## 2023-08-01 DIAGNOSIS — M722 Plantar fascial fibromatosis: Secondary | ICD-10-CM

## 2023-08-01 MED ORDER — NAPROXEN 500 MG PO TABS: 500.0000 mg | ORAL_TABLET | Freq: Two times a day (BID) | ORAL | 0 refills | Status: DC | PRN
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
## 2023-08-01 NOTE — ED Triage Notes (Signed)
 Pt presents with c/o rt foot pain x 2 wks. T states her rt great to also hurts and states she thinks she has an ingrown toe nail. C/o rt shoulder soreness, states she recently started a new job and has been lifting a lot.

## 2023-08-01 NOTE — Discharge Instructions (Signed)
 Naproxen twice daily for 7 days.  This will help with your plantar fasciitis as well as your shoulder pain.  Use a soda can or water bottle to stretch the sole of your right foot by rolling it over the can/bottle in the morning.  Look into getting shoe inserts to help support your arch, these can be purchased over-the-counter.  Do Epsom salt soaks for your toenail frequently.  Please follow-up with podiatry for further treatment of your ingrown toenail as well as your plantar fasciitis.  You may do heat to the shoulder as needed.  Follow-up with your PCP if your symptoms do not improve.  ER precautions reviewed and patient verbalized understanding.

## 2023-10-24 ENCOUNTER — Ambulatory Visit (HOSPITAL_COMMUNITY)
Admission: EM | Admit: 2023-10-24 | Discharge: 2023-10-24 | Disposition: A | Discharge status: Home / Self Care | Payer: Self-pay | Attending: Family Medicine | Admitting: Family Medicine

## 2023-10-24 ENCOUNTER — Encounter (HOSPITAL_COMMUNITY): Payer: Self-pay

## 2023-10-24 DIAGNOSIS — L309 Dermatitis, unspecified: Secondary | ICD-10-CM

## 2023-10-24 DIAGNOSIS — J4521 Mild intermittent asthma with (acute) exacerbation: Secondary | ICD-10-CM

## 2023-10-24 MED ORDER — DEXAMETHASONE SODIUM PHOSPHATE 10 MG/ML IJ SOLN
INTRAMUSCULAR | Status: AC
  Filled 2023-10-24: qty 1

## 2023-10-24 MED ORDER — DEXAMETHASONE SODIUM PHOSPHATE 10 MG/ML IJ SOLN
10.0000 mg | Freq: Once | INTRAMUSCULAR | Status: AC
  Administered 2023-10-24: 10 mg via INTRAMUSCULAR

## 2023-10-24 MED ORDER — PREDNISONE 20 MG PO TABS: 40.0000 mg | ORAL_TABLET | Freq: Every day | ORAL | 0 refills | Status: AC

## 2023-10-24 MED ORDER — ALBUTEROL SULFATE HFA 108 (90 BASE) MCG/ACT IN AERS: 2.0000 | INHALATION_SPRAY | RESPIRATORY_TRACT | 0 refills | Status: AC | PRN

## 2023-10-24 MED ORDER — TRIAMCINOLONE ACETONIDE 0.1 % EX OINT: 1.0000 | TOPICAL_OINTMENT | Freq: Two times a day (BID) | CUTANEOUS | 1 refills | Status: AC

## 2023-10-24 NOTE — Discharge Instructions (Signed)
 You have been given a shot of dexamethasone  10 mg, steroid.  Take prednisone  20 mg--2 daily for 5 days  Triamcinolone  ointment--apply 2 times daily to the rash area until better, about 10 to 14 days.  Albuterol  inhaler--do 2 puffs every 4 hours as needed for shortness of breath or wheezing
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
## 2023-10-24 NOTE — ED Triage Notes (Signed)
 Patient reports that her eczema has flared again. Patient states she was ordered a white ointment the last time and does not want it again.
